# Patient Record
Sex: Female | Born: 1970 | Race: Black or African American | Hispanic: No | Marital: Single | Smoking: Current every day smoker
Health system: Southern US, Community
[De-identification: ages and names within clinical notes are randomized; demographics above are authoritative.]

## PROBLEM LIST (undated history)

## (undated) DIAGNOSIS — K219 Gastro-esophageal reflux disease without esophagitis: Secondary | ICD-10-CM

## (undated) DIAGNOSIS — G8918 Other acute postprocedural pain: Secondary | ICD-10-CM

## (undated) DIAGNOSIS — D219 Benign neoplasm of connective and other soft tissue, unspecified: Secondary | ICD-10-CM

## (undated) HISTORY — PX: TUBAL LIGATION: SHX77

---

## 1999-12-11 ENCOUNTER — Inpatient Hospital Stay (HOSPITAL_COMMUNITY): Admission: AD | Admit: 1999-12-11 | Discharge: 1999-12-11 | Payer: Self-pay | Admitting: Obstetrics

## 2001-07-02 ENCOUNTER — Emergency Department (HOSPITAL_COMMUNITY): Admission: EM | Admit: 2001-07-02 | Discharge: 2001-07-02 | Payer: Self-pay | Admitting: Emergency Medicine

## 2001-07-02 ENCOUNTER — Encounter: Payer: Self-pay | Admitting: Emergency Medicine

## 2002-06-20 ENCOUNTER — Emergency Department (HOSPITAL_COMMUNITY): Admission: EM | Admit: 2002-06-20 | Discharge: 2002-06-20 | Payer: Self-pay | Admitting: Physical Therapy

## 2003-09-30 ENCOUNTER — Emergency Department (HOSPITAL_COMMUNITY): Admission: EM | Admit: 2003-09-30 | Discharge: 2003-09-30 | Payer: Self-pay | Admitting: Family Medicine

## 2007-04-13 ENCOUNTER — Emergency Department (HOSPITAL_COMMUNITY): Admission: EM | Admit: 2007-04-13 | Discharge: 2007-04-13 | Payer: Self-pay | Admitting: Emergency Medicine

## 2007-11-20 ENCOUNTER — Emergency Department (HOSPITAL_COMMUNITY): Admission: EM | Admit: 2007-11-20 | Discharge: 2007-11-20 | Payer: Self-pay | Admitting: Emergency Medicine

## 2007-11-29 ENCOUNTER — Inpatient Hospital Stay (HOSPITAL_COMMUNITY): Admission: AD | Admit: 2007-11-29 | Discharge: 2007-11-29 | Payer: Self-pay | Admitting: Family Medicine

## 2008-04-02 ENCOUNTER — Inpatient Hospital Stay (HOSPITAL_COMMUNITY): Admission: AD | Admit: 2008-04-02 | Discharge: 2008-04-02 | Payer: Self-pay | Admitting: Obstetrics & Gynecology

## 2008-12-10 ENCOUNTER — Inpatient Hospital Stay (HOSPITAL_COMMUNITY): Admission: AD | Admit: 2008-12-10 | Discharge: 2008-12-10 | Payer: Self-pay | Admitting: Obstetrics & Gynecology

## 2010-01-14 ENCOUNTER — Emergency Department (HOSPITAL_COMMUNITY)
Admission: EM | Admit: 2010-01-14 | Discharge: 2010-01-14 | Payer: Self-pay | Source: Home / Self Care | Admitting: Emergency Medicine

## 2010-01-14 LAB — RAPID STREP SCREEN (MED CTR MEBANE ONLY): Streptococcus, Group A Screen (Direct): NEGATIVE

## 2010-01-17 ENCOUNTER — Emergency Department (HOSPITAL_COMMUNITY)
Admission: EM | Admit: 2010-01-17 | Discharge: 2010-01-17 | Payer: Self-pay | Source: Home / Self Care | Admitting: Emergency Medicine

## 2010-04-01 ENCOUNTER — Inpatient Hospital Stay (INDEPENDENT_AMBULATORY_CARE_PROVIDER_SITE_OTHER)
Admission: RE | Admit: 2010-04-01 | Discharge: 2010-04-01 | Disposition: A | Discharge status: Home / Self Care | Payer: Self-pay | Source: Ambulatory Visit | Attending: Family Medicine | Admitting: Family Medicine

## 2010-04-01 DIAGNOSIS — J40 Bronchitis, not specified as acute or chronic: Secondary | ICD-10-CM

## 2010-04-01 DIAGNOSIS — J029 Acute pharyngitis, unspecified: Secondary | ICD-10-CM

## 2010-04-01 LAB — POCT RAPID STREP A (OFFICE): Streptococcus, Group A Screen (Direct): NEGATIVE

## 2010-04-13 LAB — GC/CHLAMYDIA PROBE AMP, GENITAL
Chlamydia, DNA Probe: NEGATIVE
GC Probe Amp, Genital: NEGATIVE

## 2010-04-13 LAB — WET PREP, GENITAL
Trich, Wet Prep: NONE SEEN
Yeast Wet Prep HPF POC: NONE SEEN

## 2010-04-22 LAB — GC/CHLAMYDIA PROBE AMP, GENITAL
Chlamydia, DNA Probe: NEGATIVE
GC Probe Amp, Genital: NEGATIVE

## 2010-04-22 LAB — WET PREP, GENITAL
Trich, Wet Prep: NONE SEEN
Yeast Wet Prep HPF POC: NONE SEEN

## 2010-04-22 LAB — URINALYSIS, ROUTINE W REFLEX MICROSCOPIC
Bilirubin Urine: NEGATIVE
Glucose, UA: NEGATIVE mg/dL
Hgb urine dipstick: NEGATIVE
Ketones, ur: 15 mg/dL — AB
Nitrite: NEGATIVE
Protein, ur: NEGATIVE mg/dL
Specific Gravity, Urine: 1.02 (ref 1.005–1.030)
Urobilinogen, UA: 1 mg/dL (ref 0.0–1.0)
pH: 7.5 (ref 5.0–8.0)

## 2010-04-22 LAB — POCT PREGNANCY, URINE: Preg Test, Ur: NEGATIVE

## 2010-10-12 LAB — BASIC METABOLIC PANEL
BUN: 13
CO2: 28
Calcium: 9.4
Chloride: 100
Creatinine, Ser: 0.73
GFR calc Af Amer: >60
GFR calc non Af Amer: >60
Glucose, Bld: 101 — ABNORMAL HIGH
Potassium: 3.6
Sodium: 139

## 2010-10-12 LAB — GC/CHLAMYDIA PROBE AMP, GENITAL
Chlamydia, DNA Probe: NEGATIVE
GC Probe Amp, Genital: NEGATIVE

## 2010-10-12 LAB — WET PREP, GENITAL
Clue Cells Wet Prep HPF POC: NONE SEEN
Yeast Wet Prep HPF POC: NONE SEEN

## 2010-10-12 LAB — DIFFERENTIAL
Basophils Absolute: 0
Basophils Relative: 0
Eosinophils Absolute: 0
Eosinophils Relative: 1
Lymphocytes Relative: 36
Lymphs Abs: 2.4
Monocytes Absolute: 0.6
Monocytes Relative: 9
Neutro Abs: 3.5
Neutrophils Relative %: 53

## 2010-10-12 LAB — POCT PREGNANCY, URINE
Preg Test, Ur: NEGATIVE
Preg Test, Ur: NEGATIVE

## 2010-10-12 LAB — CBC
HCT: 33.6 — ABNORMAL LOW
Hemoglobin: 11.1 — ABNORMAL LOW
MCHC: 33.1
MCV: 90.9
Platelets: 302
RBC: 3.69 — ABNORMAL LOW
RDW: 15.1
WBC: 6.6

## 2010-10-12 LAB — URINE MICROSCOPIC-ADD ON

## 2010-10-12 LAB — URINALYSIS, ROUTINE W REFLEX MICROSCOPIC
Bilirubin Urine: NEGATIVE
Bilirubin Urine: NEGATIVE
Glucose, UA: NEGATIVE
Glucose, UA: NEGATIVE
Hgb urine dipstick: NEGATIVE
Hgb urine dipstick: NEGATIVE
Ketones, ur: NEGATIVE
Ketones, ur: NEGATIVE
Nitrite: NEGATIVE
Nitrite: NEGATIVE
Protein, ur: NEGATIVE
Protein, ur: NEGATIVE
Specific Gravity, Urine: 1.015
Specific Gravity, Urine: 1.015
Urobilinogen, UA: 0.2
Urobilinogen, UA: 0.2
pH: 6.5
pH: 8.5 — ABNORMAL HIGH

## 2010-10-12 LAB — POCT CARDIAC MARKERS
CKMB, poc: <1 — ABNORMAL LOW
Myoglobin, poc: 37.7
Troponin i, poc: <0.05

## 2011-05-23 ENCOUNTER — Encounter (HOSPITAL_COMMUNITY): Payer: Self-pay | Admitting: Emergency Medicine

## 2011-05-23 ENCOUNTER — Emergency Department (HOSPITAL_COMMUNITY)
Admission: EM | Admit: 2011-05-23 | Discharge: 2011-05-23 | Disposition: A | Discharge status: Home / Self Care | Payer: Self-pay | Attending: Emergency Medicine | Admitting: Emergency Medicine

## 2011-05-23 DIAGNOSIS — R509 Fever, unspecified: Secondary | ICD-10-CM | POA: Insufficient documentation

## 2011-05-23 DIAGNOSIS — K5289 Other specified noninfective gastroenteritis and colitis: Secondary | ICD-10-CM | POA: Insufficient documentation

## 2011-05-23 DIAGNOSIS — K529 Noninfective gastroenteritis and colitis, unspecified: Secondary | ICD-10-CM

## 2011-05-23 HISTORY — DX: Gastro-esophageal reflux disease without esophagitis: K21.9

## 2011-05-23 MED ORDER — ONDANSETRON HCL 8 MG PO TABS: 8.0000 mg | ORAL_TABLET | Freq: Three times a day (TID) | ORAL | Status: AC | PRN

## 2011-05-23 MED ORDER — ONDANSETRON 4 MG PO TBDP
8.0000 mg | ORAL_TABLET | Freq: Once | ORAL | Status: AC
  Administered 2011-05-23: 8 mg via ORAL
  Filled 2011-05-23: qty 2

## 2011-05-23 NOTE — ED Notes (Signed)
N/v/d since last night now her throat hurts

## 2011-05-23 NOTE — ED Notes (Signed)
Gave pt apple juice to drink. Pt tolerated.

## 2011-05-23 NOTE — ED Provider Notes (Signed)
History     CSN: [id number]  Arrival date & time 05/23/11  1343   First MD Initiated Contact with Patient 05/23/11 1614      Chief Complaint  Patient presents with  . Diarrhea    (Consider location/radiation/quality/duration/timing/severity/associated sxs/prior treatment) HPI Comments: Olivia Dunn is a 41 y.o. Female with nausea, vomiting, and diarrhea that started yesterday. No blood in emesis or stool. She is a mild, low-grade fever. She denies weakness, dizziness, or syncope and near-syncope. No cough, shortness of breath, chest pain or back pain. No medications at home. She is unable to eat or drink because it causes vomiting.  Patient is a 41 y.o. female presenting with diarrhea. The history is provided by the patient.  Diarrhea The primary symptoms include diarrhea.    Past Medical History  Diagnosis Date  . Acid reflux     History reviewed. No pertinent past surgical history.  No family history on file.  History  Substance Use Topics  . Smoking status: Current Everyday Smoker  . Smokeless tobacco: Not on file  . Alcohol Use: Yes    OB History    Grav Para Term Preterm Abortions TAB SAB Ect Mult Living                  Review of Systems  Gastrointestinal: Positive for diarrhea.  All other systems reviewed and are negative.    Allergies  Review of patient's allergies indicates no known allergies.  Home Medications   Current Outpatient Rx  Name Route Sig Dispense Refill  . ONDANSETRON HCL 8 MG PO TABS Oral Take 1 tablet (8 mg total) by mouth every 8 (eight) hours as needed for nausea. 40 tablet 0    BP 123/67  Pulse 80  Temp(Src) 98.9 F (37.2 C) (Oral)  Resp 20  SpO2 99%  Physical Exam  Nursing note and vitals reviewed. Constitutional: She is oriented to person, place, and time. She appears well-developed and well-nourished.  HENT:  Head: Normocephalic and atraumatic.  Eyes: Conjunctivae and EOM are normal. Pupils are equal, round,  and reactive to light.  Neck: Normal range of motion and phonation normal. Neck supple.  Cardiovascular: Normal rate, regular rhythm and intact distal pulses.   Pulmonary/Chest: Effort normal and breath sounds normal. She exhibits no tenderness.  Abdominal: Soft. She exhibits no distension. There is no tenderness. There is no guarding.  Musculoskeletal: Normal range of motion.  Neurological: She is alert and oriented to person, place, and time. She has normal strength. She exhibits normal muscle tone.  Skin: Skin is warm and dry.  Psychiatric: She has a normal mood and affect. Her behavior is normal. Judgment and thought content normal.    ED Course  Procedures (including critical care time)  Treated with Zofran; able to tolerate fluids afterwards.   Labs Reviewed - No data to display No results found.   1. Gastroenteritis       MDM  Evaluation Consistent with viral gastroenteritis. Doubt, suspect he'll illness metabolic instability or impending vascular collapse.  Plan: Home Medications- zofran; Home Treatments- gradual diet advance; Recommended follow up- PCP of choice prn        Flint Melter, MD 05/23/11 [id number]

## 2011-05-23 NOTE — Discharge Instructions (Signed)
Use a clear liquid diet for one day then a bland diet, until feeling better.  B.R.A.T. Diet Your doctor has recommended the B.R.A.T. diet for you or your child until the condition improves. This is often used to help control diarrhea and vomiting symptoms. If you or your child can tolerate clear liquids, you may have:  Bananas.   Rice.   Applesauce.   Toast (and other simple starches such as crackers, potatoes, noodles).  Be sure to avoid dairy products, meats, and fatty foods until symptoms are better. Fruit juices such as apple, grape, and prune juice can make diarrhea worse. Avoid these. Continue this diet for 2 days or as instructed by your caregiver. Document Released: 12/27/2004 Document Revised: 12/16/2010 Document Reviewed: 06/15/2006 [facility] Patient Information 2012 [place], [place].Clear Liquid Diet The clear liquid dietconsists of foods that are liquid or will become liquid at room temperature.You should be able to see through the liquid and beverages. Examples of foods allowed on a clear liquid diet include fruit juice, broth or bouillon, gelatin, or frozen ice pops. The purpose of this diet is to provide necessary fluid, electrolytes such as sodium and potassium, and energy to keep the body functioning during times when you are not able to consume a regular diet.A clear liquid diet should not be continued for long periods of time as it is not nutritionally adequate.  REASONS FOR USING A CLEAR LIQUID DIET  In sudden onset (acute) conditions for a patient before or after surgery.   As the first step in oral feeding.   For fluid and electrolyte replacement in diarrheal diseases.   As a diet before certain medical tests are performed.  ADEQUACY The clear liquid diet is adequate only in ascorbic acid, according to the Recommended Dietary Allowances of the [employer]. CHOOSING FOODS Breads and Starches  Allowed:  None are allowed.   Avoid: All are avoided.    Vegetables  Allowed:  Strained tomato or vegetable juice.   Avoid: Any others.  Fruit  Allowed:  Strained fruit juices and fruit drinks. Include 1 serving of citrus or vitamin C-enriched fruit juice daily.   Avoid: Any others.  Meat and Meat Substitutes  Allowed:  None are allowed.   Avoid: All are avoided.  Milk  Allowed:  None are allowed.   Avoid: All are avoided.  Soups and Combination Foods  Allowed:  Clear bouillon, broth, or strained broth-based soups.   Avoid: Any others.  Desserts and Sweets  Allowed:  Sugar, honey. High protein gelatin. Flavored gelatin, ices, or frozen ice pops that do not contain milk.   Avoid: Any others.  Fats and Oils  Allowed:  None are allowed.   Avoid: All are avoided.  Beverages  Allowed: Cereal beverages, coffee (regular or decaffeinated), tea, or soda at the discretion of your caregiver.   Avoid: Any others.  Condiments  Allowed:  Iodized salt.   Avoid: Any others, including pepper.  Supplements  Allowed:  Liquid nutrition beverages.   Avoid: Any others that contain lactose or fiber.  SAMPLE MEAL PLAN Breakfast  4 oz (120 mL) strained orange juice.    to 1 cup (125 to 250 mL) gelatin (plain or fortified).   1 cup (250 mL) beverage (coffee or tea).   Sugar, if desired.  Midmorning Snack   cup (125 mL) gelatin (plain or fortified).  Lunch  1 cup (250 mL) broth or consomm.   4 oz (120 mL) strained grapefruit juice.    cup (125  mL) gelatin (plain or fortified).   1 cup (250 mL) beverage (coffee or tea).   Sugar, if desired.  Midafternoon Snack   cup (125 mL) fruit ice.    cup (125 mL) strained fruit juice.  Dinner  1 cup (250 mL) broth or consomm.    cup (125 mL) cranberry juice.    cup (125 mL) flavored gelatin (plain or fortified).   1 cup (250 mL) beverage (coffee or tea).   Sugar, if desired.  Evening Snack  4 oz (120 mL) strained apple juice (vitamin C-fortified).    cup  (125 mL) flavored gelatin (plain or fortified).  Document Released: 12/27/2004 Document Revised: 12/16/2010 Document Reviewed: 03/26/2010 [facility] Patient Information 2012 [place], [place].

## 2011-05-23 NOTE — ED Notes (Addendum)
Pt reports N/V/D x1 day, pt last experienced N/V/D 0800 this am, pt reports she has been in the bed since 0830 this am, woke up at noon feeling weak and has been here ever since 1300 today. Pt drank a half of bottle of apple juice while in the waiting room and has been able to keep the juice down. Pt is requesting a dr.s note excusing her from work 3-4 days.

## 2012-01-01 ENCOUNTER — Emergency Department (HOSPITAL_COMMUNITY)
Admission: EM | Admit: 2012-01-01 | Discharge: 2012-01-01 | Disposition: A | Discharge status: Home Health | Payer: Self-pay | Attending: Emergency Medicine | Admitting: Emergency Medicine

## 2012-01-01 ENCOUNTER — Encounter (HOSPITAL_COMMUNITY): Payer: Self-pay | Admitting: *Deleted

## 2012-01-01 DIAGNOSIS — W230XXA Caught, crushed, jammed, or pinched between moving objects, initial encounter: Secondary | ICD-10-CM | POA: Insufficient documentation

## 2012-01-01 DIAGNOSIS — S81839A Puncture wound without foreign body, unspecified lower leg, initial encounter: Secondary | ICD-10-CM

## 2012-01-01 DIAGNOSIS — Y929 Unspecified place or not applicable: Secondary | ICD-10-CM | POA: Insufficient documentation

## 2012-01-01 DIAGNOSIS — F17200 Nicotine dependence, unspecified, uncomplicated: Secondary | ICD-10-CM | POA: Insufficient documentation

## 2012-01-01 DIAGNOSIS — Z79899 Other long term (current) drug therapy: Secondary | ICD-10-CM | POA: Insufficient documentation

## 2012-01-01 DIAGNOSIS — S81009A Unspecified open wound, unspecified knee, initial encounter: Secondary | ICD-10-CM | POA: Insufficient documentation

## 2012-01-01 DIAGNOSIS — Y939 Activity, unspecified: Secondary | ICD-10-CM | POA: Insufficient documentation

## 2012-01-01 DIAGNOSIS — S91009A Unspecified open wound, unspecified ankle, initial encounter: Secondary | ICD-10-CM | POA: Insufficient documentation

## 2012-01-01 DIAGNOSIS — Z8719 Personal history of other diseases of the digestive system: Secondary | ICD-10-CM | POA: Insufficient documentation

## 2012-01-01 DIAGNOSIS — L0291 Cutaneous abscess, unspecified: Secondary | ICD-10-CM | POA: Insufficient documentation

## 2012-01-01 DIAGNOSIS — L0390 Cellulitis, unspecified: Secondary | ICD-10-CM

## 2012-01-01 MED ORDER — HYDROCODONE-ACETAMINOPHEN 5-325 MG PO TABS: 1.0000 | ORAL_TABLET | Freq: Once | ORAL | Status: DC

## 2012-01-01 MED ORDER — HYDROCODONE-ACETAMINOPHEN 5-325 MG PO TABS: ORAL_TABLET | ORAL | Status: DC

## 2012-01-01 MED ORDER — CEPHALEXIN 250 MG PO CAPS: 500.0000 mg | ORAL_CAPSULE | Freq: Once | ORAL | Status: DC

## 2012-01-01 MED ORDER — CEPHALEXIN 500 MG PO CAPS: 500.0000 mg | ORAL_CAPSULE | Freq: Two times a day (BID) | ORAL | Status: DC

## 2012-01-01 NOTE — ED Notes (Addendum)
C/o ankle injury Friday night, car door hit R lateral ankle/leg, c/o pain and swelling. Abrasion noted to R lateral ankle. Pinpoints pain to medial and posterior ankle leg/lower calf. "concerned for infection". No known fever. Warm to touch, redness noted.

## 2012-01-01 NOTE — Discharge Instructions (Signed)
Cellulitis Cellulitis is an infection of the skin and the tissue beneath it. The infected area is usually red and tender. Cellulitis occurs most often in the arms and lower legs.   CAUSES   Cellulitis is caused by bacteria that enter the skin through cracks or cuts in the skin. The most common types of bacteria that cause cellulitis are Staphylococcus and Streptococcus. SYMPTOMS    Redness and warmth.   Swelling.   Tenderness or pain.   Fever.  DIAGNOSIS  Your caregiver can usually determine what is wrong based on a physical exam. Blood tests may also be done. TREATMENT   Treatment usually involves taking an antibiotic medicine. HOME CARE INSTRUCTIONS    Take your antibiotics as directed. Finish them even if you start to feel better.   Keep the infected arm or leg elevated to reduce swelling.   Apply a warm cloth to the affected area up to 4 times per day to relieve pain.   Only take over-the-counter or prescription medicines for pain, discomfort, or fever as directed by your caregiver.   Keep all follow-up appointments as directed by your caregiver.  SEEK MEDICAL CARE IF:    You notice red streaks coming from the infected area.   Your red area gets larger or turns dark in color.   Your bone or joint underneath the infected area becomes painful after the skin has healed.   Your infection returns in the same area or another area.   You notice a swollen bump in the infected area.   You develop new symptoms.  SEEK IMMEDIATE MEDICAL CARE IF:    You have a fever.   You feel very sleepy.   You develop vomiting or diarrhea.   You have a general ill feeling (malaise) with muscle aches and pains.  MAKE SURE YOU:    Understand these instructions.   Will watch your condition.   Will get help right away if you are not doing well or get worse.  Document Released: 10/06/2004 Document Revised: 06/28/2011 Document Reviewed: 03/14/2011 [employer] Patient Information 2013  [employer].    

## 2012-01-01 NOTE — ED Provider Notes (Signed)
History     CSN: [id number]  Arrival date & time 01/01/12  2230   First MD Initiated Contact with Patient 01/01/12 2256      Chief Complaint  Patient presents with  . Ankle Pain    (Consider location/radiation/quality/duration/timing/severity/associated sxs/prior treatment) HPI Comments: Pt scratched the outer, lateral portion of right lower leg 2 days ago on car door corner.  Pt was wearing pants.  Pt has no h/o DM, immunocompromise.  She thought she just had a small scratch.  The next day, noticed that she had a slight puncture wound and abrasion there.  No bleeding.  Pain has progressed, she feels lower leg is more swollen and she cannot bear weight that well on it.  No systemic fevers or chills, no numbness or weakness.  She continued shopping on the day she injured it, had no difficulty with walking.  Pain is sharp and throbbing in nature.  No CP, SOB, palpitations, cough, pleuritic pain.  No recent long distance travel otherwise.    Patient is a 41 y.o. female presenting with ankle pain. The history is provided by the patient.  Ankle Pain  Pertinent negatives include no numbness.    Past Medical History  Diagnosis Date  . Acid reflux     Past Surgical History  Procedure Date  . Tubal ligation     History reviewed. No pertinent family history.  History  Substance Use Topics  . Smoking status: Current Every Day Smoker  . Smokeless tobacco: Not on file  . Alcohol Use: Yes    OB History    Grav Para Term Preterm Abortions TAB SAB Ect Mult Living                  Review of Systems  Constitutional: Negative for fever and chills.  Respiratory: Negative for cough and shortness of breath.   Cardiovascular: Positive for leg swelling. Negative for chest pain.  Musculoskeletal: Positive for arthralgias. Negative for joint swelling.  Skin: Positive for wound.  Neurological: Negative for weakness and numbness.  All other systems reviewed and are  negative.    Allergies  Review of patient's allergies indicates no known allergies.  Home Medications   Current Outpatient Rx  Name  Route  Sig  Dispense  Refill  . ADULT MULTIVITAMIN W/MINERALS CH   Oral   Take 1 tablet by mouth daily.         . CEPHALEXIN 500 MG PO CAPS   Oral   Take 1 capsule (500 mg total) by mouth 2 (two) times daily.   14 capsule   0   . HYDROCODONE-ACETAMINOPHEN 5-325 MG PO TABS      1-2 tablets po q 6 hours prn moderate to severe pain   20 tablet   0     BP 132/57  Pulse 82  Temp 98.3 F (36.8 C) (Oral)  Resp 16  SpO2 100%  LMP 12/26/2011  Physical Exam  Nursing note and vitals reviewed. Constitutional: She appears well-developed and well-nourished.  Non-toxic appearance. She does not have a sickly appearance. She does not appear ill. No distress.  HENT:  Head: Normocephalic and atraumatic.  Neck: Normal range of motion.  Cardiovascular: Normal rate, regular rhythm and normal pulses.   Pulmonary/Chest: Effort normal. No respiratory distress.  Abdominal: Soft.  Musculoskeletal:       Right knee: Normal.       Right ankle: She exhibits decreased range of motion and swelling. She exhibits no ecchymosis, no deformity  and normal pulse.       Legs:      Locally tender at area of abrasion.  Wound is clean, no sig surrounding erythema.  No purulent drainage.  Neg Homan's on right lower calf  Neurological: She is alert. She has normal strength. No sensory deficit. She exhibits normal muscle tone. GCS eye subscore is 4. GCS verbal subscore is 5. GCS motor subscore is 6.  Skin: She is not diaphoretic.    ED Course  Procedures (including critical care time)  Labs Reviewed - No data to display No results found.   1. Cellulitis   2. Puncture wound of lower leg     ra sat is 100% and i interpret to be normal  MDM  Wound doesn't require closure, is more rounding.  Puncture is not deep.  No abscess.  No symptoms or signs of DVT or PE.   Will give analgesics and abx.  Recommend close follow up or return if symptoms of infection worsen.  Pt and family agree, no further questions.         Gavin Pound. Oletta Lamas, MD 01/01/12 6213

## 2012-04-30 ENCOUNTER — Emergency Department (HOSPITAL_COMMUNITY): Payer: Self-pay

## 2012-04-30 ENCOUNTER — Emergency Department (HOSPITAL_COMMUNITY)
Admission: EM | Admit: 2012-04-30 | Discharge: 2012-04-30 | Disposition: A | Discharge status: Home / Self Care | Payer: Self-pay | Attending: Emergency Medicine | Admitting: Emergency Medicine

## 2012-04-30 ENCOUNTER — Encounter (HOSPITAL_COMMUNITY): Payer: Self-pay | Admitting: Emergency Medicine

## 2012-04-30 DIAGNOSIS — R059 Cough, unspecified: Secondary | ICD-10-CM | POA: Insufficient documentation

## 2012-04-30 DIAGNOSIS — J329 Chronic sinusitis, unspecified: Secondary | ICD-10-CM | POA: Insufficient documentation

## 2012-04-30 DIAGNOSIS — R05 Cough: Secondary | ICD-10-CM | POA: Insufficient documentation

## 2012-04-30 DIAGNOSIS — F17200 Nicotine dependence, unspecified, uncomplicated: Secondary | ICD-10-CM | POA: Insufficient documentation

## 2012-04-30 DIAGNOSIS — Z8719 Personal history of other diseases of the digestive system: Secondary | ICD-10-CM | POA: Insufficient documentation

## 2012-04-30 MED ORDER — GUAIFENESIN 100 MG/5ML PO SYRP: 100.0000 mg | ORAL_SOLUTION | ORAL | Status: DC | PRN

## 2012-04-30 MED ORDER — DEXTROMETHORPHAN POLISTIREX 30 MG/5ML PO LQCR
10.0000 mL | Freq: Once | ORAL | Status: AC
  Administered 2012-04-30: 60 mg via ORAL
  Filled 2012-04-30: qty 10

## 2012-04-30 MED ORDER — DIPHENHYDRAMINE HCL 25 MG PO CAPS
25.0000 mg | ORAL_CAPSULE | Freq: Once | ORAL | Status: AC
  Administered 2012-04-30: 25 mg via ORAL
  Filled 2012-04-30: qty 1

## 2012-04-30 MED ORDER — SULFAMETHOXAZOLE-TRIMETHOPRIM 800-160 MG PO TABS: 1.0000 | ORAL_TABLET | Freq: Two times a day (BID) | ORAL | Status: DC

## 2012-04-30 NOTE — ED Notes (Signed)
Pt reports fever cough, congestion for 4 weeks with no relief from OTC meds. Today she also reports pain on left side when she coughs. Pt reports some SOB and did have syncopal episode last week while at work. And was seen in the ER at the [facility] she works in.

## 2012-04-30 NOTE — ED Provider Notes (Signed)
Medical screening examination/treatment/procedure(s) were performed by non-physician practitioner and as supervising physician I was immediately available for consultation/collaboration.    Devri Kreher R Krystofer Hevener, MD 04/30/12 2231 

## 2012-04-30 NOTE — ED Notes (Signed)
Pt given and explained discharge instructions; stated understanding; pt stable at discharge

## 2012-04-30 NOTE — ED Provider Notes (Signed)
History     CSN: [id number]  Arrival date & time 04/30/12  1357   First MD Initiated Contact with Patient 04/30/12 1533      No chief complaint on file.   (Consider location/radiation/quality/duration/timing/severity/associated sxs/prior treatment) HPI Comments: Patient is a 42 year old female who presents with sinus congestion for the past 4 weeks. Symptoms started gradually and progressively worsened since the onset. Patient reports associated cough that is sometime productive. Patient has tried OTC medications without relief. Patient reports left sided chest pain only when coughing. No aggravating/alleviating factors. No sick contacts.    Past Medical History  Diagnosis Date  . Acid reflux     Past Surgical History  Procedure Laterality Date  . Tubal ligation      No family history on file.  History  Substance Use Topics  . Smoking status: Current Every Day Smoker  . Smokeless tobacco: Not on file  . Alcohol Use: Yes    OB History   Grav Para Term Preterm Abortions TAB SAB Ect Mult Living                  Review of Systems  HENT: Positive for congestion and sinus pressure.   Respiratory: Positive for cough.   All other systems reviewed and are negative.    Allergies  Review of patient's allergies indicates no known allergies.  Home Medications   Current Outpatient Rx  Name  Route  Sig  Dispense  Refill  . DM-Phenylephrine-Acetaminophen (ALKA-SELTZER PLS SINUS & COUGH) 10-5-325 MG CAPS   Oral   Take 2 capsules by mouth every 4 (four) hours as needed (as needed for cough and congestion).         Marland Kitchen guaiFENesin-dextromethorphan (ROBITUSSIN DM) 100-10 MG/5ML syrup   Oral   Take 30 mLs by mouth every 4 (four) hours as needed for cough.         . Phenyleph-CPM-DM-APAP (TYLENOL COLD MULTI-SYMPTOM PO)   Oral   Take 2 tablets by mouth every 4 (four) hours as needed (as needed for cold symptoms).           BP 122/73  Pulse 79  Temp(Src) 98.7 F  (37.1 C) (Oral)  Resp 18  SpO2 99%  LMP 04/05/2012  Physical Exam  Nursing note and vitals reviewed. Constitutional: She is oriented to person, place, and time. She appears well-developed and well-nourished. No distress.  HENT:  Head: Normocephalic and atraumatic.  Mouth/Throat: Oropharynx is clear and moist. No oropharyngeal exudate.  Maxillary sinus tenderness to palpation.   Eyes: Conjunctivae and EOM are normal. Pupils are equal, round, and reactive to light.  Neck: Normal range of motion. Neck supple.  Cardiovascular: Normal rate and regular rhythm.  Exam reveals no gallop and no friction rub.   No murmur heard. Pulmonary/Chest: Effort normal and breath sounds normal. She has no wheezes. She has no rales. She exhibits no tenderness.  Abdominal: Soft. She exhibits no distension. There is no tenderness. There is no rebound and no guarding.  Musculoskeletal: Normal range of motion.  Neurological: She is alert and oriented to person, place, and time. Coordination normal.  Speech is goal-oriented. Moves limbs without ataxia.   Skin: Skin is warm and dry.  Psychiatric: She has a normal mood and affect. Her behavior is normal.    ED Course  Procedures (including critical care time)  Labs Reviewed - No data to display Dg Chest 2 View  04/30/2012  *RADIOLOGY REPORT*  Clinical Data: Cough.  Back  and left chest pain.  CHEST - 2 VIEW  Comparison: 11/20/2007  Findings: Cardiac and mediastinal contours appear normal.  The lungs appear clear.  No pleural effusion is identified.  IMPRESSION:  No significant abnormality identified.   Original Report Authenticated By: Gaylyn Rong, M.D.      1. Sinusitis       MDM  3:48 PM Chest xray unremarkable for acute changes. Patient likely has sinus infection. I will discharge the patient with bactrim and guaifenesin. Vitals stable and patient is afebrile. Patient is PERC negative. Patient instructed to return with worsening or concerning  symptoms.         Emilia Beck, PA-C 04/30/12 1556

## 2012-09-10 ENCOUNTER — Inpatient Hospital Stay (HOSPITAL_COMMUNITY)
Admission: AD | Admit: 2012-09-10 | Discharge: 2012-09-11 | Disposition: A | Discharge status: Home / Self Care | Payer: Self-pay | Source: Ambulatory Visit | Attending: Obstetrics and Gynecology | Admitting: Obstetrics and Gynecology

## 2012-09-10 ENCOUNTER — Encounter (HOSPITAL_COMMUNITY): Payer: Self-pay

## 2012-09-10 DIAGNOSIS — D251 Intramural leiomyoma of uterus: Secondary | ICD-10-CM | POA: Insufficient documentation

## 2012-09-10 DIAGNOSIS — R102 Pelvic and perineal pain: Secondary | ICD-10-CM

## 2012-09-10 DIAGNOSIS — D259 Leiomyoma of uterus, unspecified: Secondary | ICD-10-CM | POA: Insufficient documentation

## 2012-09-10 DIAGNOSIS — N946 Dysmenorrhea, unspecified: Secondary | ICD-10-CM | POA: Insufficient documentation

## 2012-09-10 DIAGNOSIS — R109 Unspecified abdominal pain: Secondary | ICD-10-CM | POA: Insufficient documentation

## 2012-09-10 DIAGNOSIS — D219 Benign neoplasm of connective and other soft tissue, unspecified: Secondary | ICD-10-CM

## 2012-09-10 DIAGNOSIS — N83209 Unspecified ovarian cyst, unspecified side: Secondary | ICD-10-CM | POA: Insufficient documentation

## 2012-09-10 DIAGNOSIS — N949 Unspecified condition associated with female genital organs and menstrual cycle: Secondary | ICD-10-CM | POA: Insufficient documentation

## 2012-09-10 LAB — POCT PREGNANCY, URINE: Preg Test, Ur: NEGATIVE

## 2012-09-10 LAB — URINALYSIS, ROUTINE W REFLEX MICROSCOPIC
Bilirubin Urine: NEGATIVE
Hgb urine dipstick: NEGATIVE
Nitrite: NEGATIVE
Protein, ur: NEGATIVE mg/dL
Specific Gravity, Urine: >1.03 — ABNORMAL HIGH (ref 1.005–1.030)
Urobilinogen, UA: 0.2 mg/dL (ref 0.0–1.0)

## 2012-09-10 NOTE — MAU Provider Note (Signed)
History     CSN: [id number]  Arrival date and time: 09/10/12 2312   None     Chief Complaint  Patient presents with  . Dysmenorrhea  . Possible Pregnancy   HPI  Olivia Dunn is a 42 y.o. [phone] who presents today with 10/10 abdominal pain x 2 weeks. She states that she has tried tylenol, ibuprofen and aleve and none have helped. She denies any nausea/vomiting/fever/diarrhea. She has had some constipation. She states that she has not had a period since July.   She has not had a pap smear in >5 years and reports a history of abnormal paps. Past Medical History  Diagnosis Date  . Acid reflux     Past Surgical History  Procedure Laterality Date  . Tubal ligation      History reviewed. No pertinent family history.  History  Substance Use Topics  . Smoking status: Current Every Day Smoker  . Smokeless tobacco: Not on file  . Alcohol Use: Yes    Allergies: No Known Allergies  Prescriptions prior to admission  Medication Sig Dispense Refill  . acetaminophen (TYLENOL) 500 MG tablet Take 500 mg by mouth every 6 (six) hours as needed for pain.      Marland Kitchen ibuprofen (ADVIL,MOTRIN) 200 MG tablet Take 200 mg by mouth every 6 (six) hours as needed for pain.      . Multiple Vitamin (MULTIVITAMIN) tablet Take 1 tablet by mouth daily.      . naproxen sodium (ANAPROX) 220 MG tablet Take 220 mg by mouth 2 (two) times daily with a meal.      . DM-Phenylephrine-Acetaminophen (ALKA-SELTZER PLS SINUS & COUGH) 10-5-325 MG CAPS Take 2 capsules by mouth every 4 (four) hours as needed (as needed for cough and congestion).      Marland Kitchen guaifenesin (ROBITUSSIN) 100 MG/5ML syrup Take 5-10 mLs (100-200 mg total) by mouth every 4 (four) hours as needed for cough.  60 mL  0  . guaiFENesin-dextromethorphan (ROBITUSSIN DM) 100-10 MG/5ML syrup Take 30 mLs by mouth every 4 (four) hours as needed for cough.      . Phenyleph-CPM-DM-APAP (TYLENOL COLD MULTI-SYMPTOM PO) Take 2 tablets by mouth every 4 (four)  hours as needed (as needed for cold symptoms).      Marland Kitchen sulfamethoxazole-trimethoprim (SEPTRA DS) 800-160 MG per tablet Take 1 tablet by mouth every 12 (twelve) hours.  20 tablet  0    ROS Physical Exam   Blood pressure 137/109, pulse 76, temperature 98 F (36.7 C), temperature source Oral, resp. rate 18, height 5\' 7"  (1.702 m), weight 72.031 kg (158 lb 12.8 oz), last menstrual period 08/02/2012.  Physical Exam  Nursing note and vitals reviewed. Constitutional: She is oriented to person, place, and time. She appears well-developed and well-nourished. No distress.  Cardiovascular: Normal rate.   Respiratory: Effort normal.  GI: Soft. There is no tenderness.  Genitourinary:   External: no lesion Vagina: small amount of white discharge Cervix: pink, small nabothian cyst as well as some apparent scar tissue from a past cryo.  Uterus: NSSC Adnexa: NT   Neurological: She is alert and oriented to person, place, and time.  Skin: Skin is warm and dry.  Psychiatric: She has a normal mood and affect.    MAU Course  Procedures  Results for orders placed during the hospital encounter of 09/10/12 (from the past 24 hour(s))  URINALYSIS, ROUTINE W REFLEX MICROSCOPIC     Status: Abnormal   Collection Time    09/10/12 11:25  PM      Result Value Range   Color, Urine YELLOW  YELLOW   APPearance HAZY (*) CLEAR   Specific Gravity, Urine >1.030 (*) 1.005 - 1.030   pH 6.0  5.0 - 8.0   Glucose, UA NEGATIVE  NEGATIVE mg/dL   Hgb urine dipstick NEGATIVE  NEGATIVE   Bilirubin Urine NEGATIVE  NEGATIVE   Ketones, ur NEGATIVE  NEGATIVE mg/dL   Protein, ur NEGATIVE  NEGATIVE mg/dL   Urobilinogen, UA 0.2  0.0 - 1.0 mg/dL   Nitrite NEGATIVE  NEGATIVE   Leukocytes, UA NEGATIVE  NEGATIVE  POCT PREGNANCY, URINE     Status: None   Collection Time    09/10/12 11:35 PM      Result Value Range   Preg Test, Ur NEGATIVE  NEGATIVE  WET PREP, GENITAL     Status: Abnormal   Collection Time    09/11/12 12:00  AM      Result Value Range   Yeast Wet Prep HPF POC NONE SEEN  NONE SEEN   Trich, Wet Prep NONE SEEN  NONE SEEN   Clue Cells Wet Prep HPF POC FEW (*) NONE SEEN   WBC, Wet Prep HPF POC FEW (*) NONE SEEN  CBC WITH DIFFERENTIAL     Status: Abnormal   Collection Time    09/11/12 12:10 AM      Result Value Range   WBC 6.2  4.0 - 10.5 K/uL   RBC 3.99  3.87 - 5.11 MIL/uL   Hemoglobin 11.5 (*) 12.0 - 15.0 g/dL   HCT 40.9 (*) 81.1 - 91.4 %   MCV 86.0  78.0 - 100.0 fL   MCH 28.8  26.0 - 34.0 pg   MCHC 33.5  30.0 - 36.0 g/dL   RDW 78.2  95.6 - 21.3 %   Platelets 279  150 - 400 K/uL   Neutrophils Relative % 56  43 - 77 %   Neutro Abs 3.5  1.7 - 7.7 K/uL   Lymphocytes Relative 36  12 - 46 %   Lymphs Abs 2.3  0.7 - 4.0 K/uL   Monocytes Relative 7  3 - 12 %   Monocytes Absolute 0.4  0.1 - 1.0 K/uL   Eosinophils Relative 1  0 - 5 %   Eosinophils Absolute 0.0  0.0 - 0.7 K/uL   Basophils Relative 0  0 - 1 %   Basophils Absolute 0.0  0.0 - 0.1 K/uL    US Transvaginal Non-ob  09/11/2012   *RADIOLOGY REPORT*  Clinical Data: Pelvic pain.  LMP 08/02/2012  TRANSABDOMINAL AND TRANSVAGINAL ULTRASOUND OF PELVIS Technique:  Both transabdominal and transvaginal ultrasound examinations of the pelvis were performed. Transabdominal technique was performed for global imaging of the pelvis including uterus, ovaries, adnexal regions, and pelvic cul-de-sac.  It was necessary to proceed with endovaginal exam following the transabdominal exam to visualize the uterus and ovaries.  Comparison:  None  Findings:  Uterus: The uterus measures 8.2 x 6.0 x 6.7 cm and contains at least five focal rounded heterogeneous masses consistent with leiomyomas.  The largest is intramural in the uterine fundus measuring 2.1 x 2.0 x 1.9 cm. The remainder of the fibroids are also intramural, with the second largest measuring  1.6 x 1.2 x 1.7 cm.  Endometrium: The endometrium measures 10 mm in thickness.  Right ovary:  Normal appearance/no  adnexal mass.  Left ovary:There is a 1.2 x 0.8 cm paraovarian cyst on the left. The left ovary measures  2.4 x 2.2 x 2.0 cm and has a normal appearance.  Other findings: Small amount of free pelvic fluid.  IMPRESSION:  2.  Multiple uterine fibroids as described above. 2.  Ovaries within normal limits. 3.  Small left paraovarian cyst.   Original Report Authenticated By: Britta Mccreedy, M.D.   US Pelvis Complete  09/11/2012   *RADIOLOGY REPORT*  Clinical Data: Pelvic pain.  LMP 08/02/2012  TRANSABDOMINAL AND TRANSVAGINAL ULTRASOUND OF PELVIS Technique:  Both transabdominal and transvaginal ultrasound examinations of the pelvis were performed. Transabdominal technique was performed for global imaging of the pelvis including uterus, ovaries, adnexal regions, and pelvic cul-de-sac.  It was necessary to proceed with endovaginal exam following the transabdominal exam to visualize the uterus and ovaries.  Comparison:  None  Findings:  Uterus: The uterus measures 8.2 x 6.0 x 6.7 cm and contains at least five focal rounded heterogeneous masses consistent with leiomyomas.  The largest is intramural in the uterine fundus measuring 2.1 x 2.0 x 1.9 cm. The remainder of the fibroids are also intramural, with the second largest measuring  1.6 x 1.2 x 1.7 cm.  Endometrium: The endometrium measures 10 mm in thickness.  Right ovary:  Normal appearance/no adnexal mass.  Left ovary:There is a 1.2 x 0.8 cm paraovarian cyst on the left. The left ovary measures 2.4 x 2.2 x 2.0 cm and has a normal appearance.  Other findings: Small amount of free pelvic fluid.  IMPRESSION:  2.  Multiple uterine fibroids as described above. 2.  Ovaries within normal limits. 3.  Small left paraovarian cyst.   Original Report Authenticated By: Britta Mccreedy, M.D.     Assessment and Plan   1. Pelvic pain   2. Fibroids    FU with GYN clinic  Needs pap   Tawnya Crook 09/10/2012, 11:59 PM

## 2012-09-10 NOTE — MAU Note (Signed)
Pt LMP 08/02/2012, having lower abd cramping, no bleeding.  Cloudy, white vaginal discharge, no itching or odor.

## 2012-09-11 ENCOUNTER — Inpatient Hospital Stay (HOSPITAL_COMMUNITY): Payer: Self-pay

## 2012-09-11 DIAGNOSIS — D259 Leiomyoma of uterus, unspecified: Secondary | ICD-10-CM

## 2012-09-11 DIAGNOSIS — N946 Dysmenorrhea, unspecified: Secondary | ICD-10-CM

## 2012-09-11 DIAGNOSIS — N949 Unspecified condition associated with female genital organs and menstrual cycle: Secondary | ICD-10-CM

## 2012-09-11 DIAGNOSIS — R109 Unspecified abdominal pain: Secondary | ICD-10-CM

## 2012-09-11 LAB — WET PREP, GENITAL: Trich, Wet Prep: NONE SEEN

## 2012-09-11 LAB — CBC WITH DIFFERENTIAL/PLATELET
Hemoglobin: 11.5 g/dL — ABNORMAL LOW (ref 12.0–15.0)
Lymphocytes Relative: 36 % (ref 12–46)
Lymphs Abs: 2.3 10*3/uL (ref 0.7–4.0)
Monocytes Relative: 7 % (ref 3–12)
Neutrophils Relative %: 56 % (ref 43–77)
Platelets: 279 10*3/uL (ref 150–400)
RBC: 3.99 MIL/uL (ref 3.87–5.11)
WBC: 6.2 10*3/uL (ref 4.0–10.5)

## 2012-09-11 LAB — GC/CHLAMYDIA PROBE AMP: CT Probe RNA: NEGATIVE

## 2012-09-11 MED ORDER — KETOROLAC TROMETHAMINE 60 MG/2ML IM SOLN
60.0000 mg | Freq: Once | INTRAMUSCULAR | Status: AC
  Administered 2012-09-11: 60 mg via INTRAMUSCULAR
  Filled 2012-09-11: qty 2

## 2012-09-11 NOTE — MAU Provider Note (Signed)
Attestation of Attending Supervision of Advanced Practitioner (CNM/NP): Evaluation and management procedures were performed by the Advanced Practitioner under my supervision and collaboration.  I have reviewed the Advanced Practitioner's note and chart, and I agree with the management and plan.  Kaelyn Innocent 09/11/2012 8:05 AM

## 2012-09-18 ENCOUNTER — Encounter: Payer: Self-pay | Admitting: Advanced Practice Midwife

## 2012-10-08 ENCOUNTER — Encounter: Payer: Self-pay | Admitting: Advanced Practice Midwife

## 2012-11-12 ENCOUNTER — Encounter: Payer: Self-pay | Admitting: Obstetrics & Gynecology

## 2013-01-31 ENCOUNTER — Encounter (HOSPITAL_COMMUNITY): Payer: Self-pay | Admitting: Emergency Medicine

## 2013-01-31 ENCOUNTER — Emergency Department (HOSPITAL_COMMUNITY)
Admission: EM | Admit: 2013-01-31 | Discharge: 2013-01-31 | Disposition: A | Discharge status: Home / Self Care | Payer: Self-pay | Attending: Emergency Medicine | Admitting: Emergency Medicine

## 2013-01-31 DIAGNOSIS — R630 Anorexia: Secondary | ICD-10-CM | POA: Insufficient documentation

## 2013-01-31 DIAGNOSIS — Z79899 Other long term (current) drug therapy: Secondary | ICD-10-CM | POA: Insufficient documentation

## 2013-01-31 DIAGNOSIS — Z8719 Personal history of other diseases of the digestive system: Secondary | ICD-10-CM | POA: Insufficient documentation

## 2013-01-31 DIAGNOSIS — J329 Chronic sinusitis, unspecified: Secondary | ICD-10-CM | POA: Insufficient documentation

## 2013-01-31 DIAGNOSIS — F17200 Nicotine dependence, unspecified, uncomplicated: Secondary | ICD-10-CM | POA: Insufficient documentation

## 2013-01-31 MED ORDER — AMOXICILLIN 500 MG PO CAPS: 500.0000 mg | ORAL_CAPSULE | Freq: Three times a day (TID) | ORAL | Status: DC

## 2013-01-31 MED ORDER — FLUTICASONE PROPIONATE 50 MCG/ACT NA SUSP: 2.0000 | Freq: Every day | NASAL | Status: DC

## 2013-01-31 NOTE — ED Notes (Addendum)
Pt. Reports feeling sick for x3 weeks with cough and low-grade fevers. States today it got worse. Reports decreased appetite and fatigue today. Lung sounds clear. Pt. Alert and oriented x4.

## 2013-01-31 NOTE — Discharge Instructions (Signed)
Take antibiotic to completion. Use nasal spray as directed. Stay well-hydrated.  Sinusitis Sinusitis is redness, soreness, and swelling (inflammation) of the paranasal sinuses. Paranasal sinuses are air pockets within the bones of your face (beneath the eyes, the middle of the forehead, or above the eyes). In healthy paranasal sinuses, mucus is able to drain out, and air is able to circulate through them by way of your nose. However, when your paranasal sinuses are inflamed, mucus and air can become trapped. This can allow bacteria and other germs to grow and cause infection. Sinusitis can develop quickly and last only a short time (acute) or continue over a long period (chronic). Sinusitis that lasts for more than 12 weeks is considered chronic.  CAUSES  Causes of sinusitis include:  Allergies.  Structural abnormalities, such as displacement of the cartilage that separates your nostrils (deviated septum), which can decrease the air flow through your nose and sinuses and affect sinus drainage.  Functional abnormalities, such as when the small hairs (cilia) that line your sinuses and help remove mucus do not work properly or are not present. SYMPTOMS  Symptoms of acute and chronic sinusitis are the same. The primary symptoms are pain and pressure around the affected sinuses. Other symptoms include:  Upper toothache.  Earache.  Headache.  Bad breath.  Decreased sense of smell and taste.  A cough, which worsens when you are lying flat.  Fatigue.  Fever.  Thick drainage from your nose, which often is green and may contain pus (purulent).  Swelling and warmth over the affected sinuses. DIAGNOSIS  Your caregiver will perform a physical exam. During the exam, your caregiver may:  Look in your nose for signs of abnormal growths in your nostrils (nasal polyps).  Tap over the affected sinus to check for signs of infection.  View the inside of your sinuses (endoscopy) with a special  imaging device with a light attached (endoscope), which is inserted into your sinuses. If your caregiver suspects that you have chronic sinusitis, one or more of the following tests may be recommended:  Allergy tests.  Nasal culture A sample of mucus is taken from your nose and sent to a lab and screened for bacteria.  Nasal cytology A sample of mucus is taken from your nose and examined by your caregiver to determine if your sinusitis is related to an allergy. TREATMENT  Most cases of acute sinusitis are related to a viral infection and will resolve on their own within 10 days. Sometimes medicines are prescribed to help relieve symptoms (pain medicine, decongestants, nasal steroid sprays, or saline sprays).  However, for sinusitis related to a bacterial infection, your caregiver will prescribe antibiotic medicines. These are medicines that will help kill the bacteria causing the infection.  Rarely, sinusitis is caused by a fungal infection. In theses cases, your caregiver will prescribe antifungal medicine. For some cases of chronic sinusitis, surgery is needed. Generally, these are cases in which sinusitis recurs more than 3 times per year, despite other treatments. HOME CARE INSTRUCTIONS   Drink plenty of water. Water helps thin the mucus so your sinuses can drain more easily.  Use a humidifier.  Inhale steam 3 to 4 times a day (for example, sit in the bathroom with the shower running).  Apply a warm, moist washcloth to your face 3 to 4 times a day, or as directed by your caregiver.  Use saline nasal sprays to help moisten and clean your sinuses.  Take over-the-counter or prescription medicines for pain,  discomfort, or fever only as directed by your caregiver. SEEK IMMEDIATE MEDICAL CARE IF:  You have increasing pain or severe headaches.  You have nausea, vomiting, or drowsiness.  You have swelling around your face.  You have vision problems.  You have a stiff neck.  You have  difficulty breathing. MAKE SURE YOU:   Understand these instructions.  Will watch your condition.  Will get help right away if you are not doing well or get worse. Document Released: 12/27/2004 Document Revised: 03/21/2011 Document Reviewed: 01/11/2011 [facility] Patient Information 2014 [place], [place].

## 2013-01-31 NOTE — ED Provider Notes (Signed)
CSN: [id number]     Arrival date & time 01/31/13  2128 History  This chart was scribed for non-physician practitioner Michele Mcalpine, PA-C, working with Sharyon Cable, MD by Eugenia Mcalpine, ED scribe. This patient was seen in room TR08C/TR08C and the patient's care was started at 10:30 PM.    Chief Complaint  Patient presents with  . Headache  . Sore Throat   (Consider location/radiation/quality/duration/timing/severity/associated sxs/prior Treatment) Patient is a 43 y.o. female presenting with headaches and pharyngitis. The history is provided by the patient. No language interpreter was used.  Headache Associated symptoms: congestion, cough, drainage, sinus pressure and sore throat   Sore Throat Associated symptoms include headaches. Pertinent negatives include no shortness of breath.  HPI Comments: Olivia Dunn is a 43 y.o. female who presents to the Emergency Department complaining of a constant sore throat for about 4 weeks.  She has taken multiple OTC medications with no relief. She lost her appetite 1x day ago. She also complains of head congestion, productive cough with clear phlegm, and head ache.  She works at the [employer] in [place] and is around many sick contacts.    Past Medical History  Diagnosis Date  . Acid reflux    Past Surgical History  Procedure Laterality Date  . Tubal ligation     No family history on file. History  Substance Use Topics  . Smoking status: Current Every Day Smoker -- 0.25 packs/day    Types: Cigarettes  . Smokeless tobacco: Not on file  . Alcohol Use: Yes   OB History   Grav Para Term Preterm Abortions TAB SAB Ect Mult Living   2 2 2       2      Review of Systems  HENT: Positive for congestion, postnasal drip, sinus pressure and sore throat.   Respiratory: Positive for cough. Negative for shortness of breath.   Neurological: Positive for headaches.  All other systems reviewed and are negative.    Allergies   Oxycodone  Home Medications   Current Outpatient Rx  Name  Route  Sig  Dispense  Refill  . BIOTIN PO   Oral   Take 1 tablet by mouth daily.         Marland Kitchen ibuprofen (ADVIL,MOTRIN) 800 MG tablet   Oral   Take 800 mg by mouth every 8 (eight) hours as needed for mild pain.         . Multiple Vitamin (MULTIVITAMIN) tablet   Oral   Take 1 tablet by mouth daily.         Marland Kitchen amoxicillin (AMOXIL) 500 MG capsule   Oral   Take 1 capsule (500 mg total) by mouth 3 (three) times daily.   21 capsule   0   . fluticasone (FLONASE) 50 MCG/ACT nasal spray   Each Nare   Place 2 sprays into both nostrils daily.   16 g   0    BP 124/75  Pulse 75  Temp(Src) 97.8 F (36.6 C) (Oral)  Resp 18  SpO2 100%  LMP 01/21/2013 Physical Exam  Nursing note and vitals reviewed. Constitutional: She is oriented to person, place, and time. She appears well-developed and well-nourished. No distress.  HENT:  Head: Normocephalic and atraumatic.  Mouth/Throat: Oropharynx is clear and moist.  Mucosal edema; frontal and maxillary sinus tenderness; post nasal drip  Eyes: Conjunctivae and EOM are normal.  Neck: Normal range of motion. Neck supple.  Cardiovascular: Normal rate, regular rhythm and normal heart sounds.  Pulmonary/Chest: Effort normal and breath sounds normal. No respiratory distress.  Musculoskeletal: Normal range of motion. She exhibits no edema.  Neurological: She is alert and oriented to person, place, and time. No sensory deficit.  Skin: Skin is warm and dry.  Psychiatric: She has a normal mood and affect. Her behavior is normal.    ED Course  Procedures (including critical care time)  DIAGNOSTIC STUDIES: Oxygen Saturation is 100% on RA, normal by my interpretation.    COORDINATION OF CARE: 10:32 PM- Pt advised of plan for treatment including antibiotic and nasal spray and pt agrees.    Labs Review Labs Reviewed - No data to display Imaging Review No results found.  EKG  Interpretation   None       MDM   1. Sinusitis    Symptoms present x4 weeks, failed symptomatic treatment. Will treat with amoxicillin, Flonase.  Stable for discharge. Return precautions given. Patient states understanding of treatment care plan and is agreeable.   I personally performed the services described in this documentation, which was scribed in my presence. The recorded information has been reviewed and is accurate.    Illene Labrador, PA-C 01/31/13 2239

## 2013-01-31 NOTE — ED Notes (Signed)
Pt states that she is having a headache and sore throat since christmas and has not gotten any better.  Pt states loss of appetite over the past two weeks.

## 2013-02-01 NOTE — ED Provider Notes (Signed)
Medical screening examination/treatment/procedure(s) were performed by non-physician practitioner and as supervising physician I was immediately available for consultation/collaboration.  EKG Interpretation   None         Sharyon Cable, MD 02/01/13 1141

## 2013-03-21 ENCOUNTER — Encounter (HOSPITAL_COMMUNITY): Payer: Self-pay | Admitting: Emergency Medicine

## 2013-03-21 ENCOUNTER — Inpatient Hospital Stay (HOSPITAL_COMMUNITY)
Admission: AD | Admit: 2013-03-21 | Discharge: 2013-03-21 | Disposition: A | Discharge status: Home / Self Care | Payer: Self-pay | Source: Ambulatory Visit | Attending: Obstetrics & Gynecology | Admitting: Obstetrics & Gynecology

## 2013-03-21 DIAGNOSIS — F17200 Nicotine dependence, unspecified, uncomplicated: Secondary | ICD-10-CM | POA: Insufficient documentation

## 2013-03-21 DIAGNOSIS — Z8719 Personal history of other diseases of the digestive system: Secondary | ICD-10-CM | POA: Insufficient documentation

## 2013-03-21 DIAGNOSIS — Z79899 Other long term (current) drug therapy: Secondary | ICD-10-CM | POA: Insufficient documentation

## 2013-03-21 DIAGNOSIS — N946 Dysmenorrhea, unspecified: Secondary | ICD-10-CM | POA: Insufficient documentation

## 2013-03-21 LAB — URINALYSIS, ROUTINE W REFLEX MICROSCOPIC
Bilirubin Urine: NEGATIVE
GLUCOSE, UA: NEGATIVE mg/dL
KETONES UR: 15 mg/dL — AB
LEUKOCYTES UA: NEGATIVE
Nitrite: NEGATIVE
PH: 5.5 (ref 5.0–8.0)
Protein, ur: NEGATIVE mg/dL
Specific Gravity, Urine: 1.028 (ref 1.005–1.030)
Urobilinogen, UA: 0.2 mg/dL (ref 0.0–1.0)

## 2013-03-21 LAB — CBC WITH DIFFERENTIAL/PLATELET
BASOS ABS: 0 10*3/uL (ref 0.0–0.1)
BASOS PCT: 0 % (ref 0–1)
EOS ABS: 0 10*3/uL (ref 0.0–0.7)
Eosinophils Relative: 0 % (ref 0–5)
HCT: 34.9 % — ABNORMAL LOW (ref 36.0–46.0)
Hemoglobin: 11.9 g/dL — ABNORMAL LOW (ref 12.0–15.0)
Lymphocytes Relative: 18 % (ref 12–46)
Lymphs Abs: 1.5 10*3/uL (ref 0.7–4.0)
MCH: 29.4 pg (ref 26.0–34.0)
MCHC: 34.1 g/dL (ref 30.0–36.0)
MCV: 86.2 fL (ref 78.0–100.0)
Monocytes Absolute: 0.6 10*3/uL (ref 0.1–1.0)
Monocytes Relative: 7 % (ref 3–12)
NEUTROS ABS: 5.9 10*3/uL (ref 1.7–7.7)
NEUTROS PCT: 74 % (ref 43–77)
PLATELETS: 334 10*3/uL (ref 150–400)
RBC: 4.05 MIL/uL (ref 3.87–5.11)
RDW: 15 % (ref 11.5–15.5)
WBC: 8 10*3/uL (ref 4.0–10.5)

## 2013-03-21 LAB — COMPREHENSIVE METABOLIC PANEL
ALBUMIN: 4 g/dL (ref 3.5–5.2)
ALK PHOS: 50 U/L (ref 39–117)
ALT: 11 U/L (ref 0–35)
AST: 14 U/L (ref 0–37)
BUN: 9 mg/dL (ref 6–23)
CHLORIDE: 101 meq/L (ref 96–112)
CO2: 25 mEq/L (ref 19–32)
Calcium: 9.2 mg/dL (ref 8.4–10.5)
Creatinine, Ser: 0.9 mg/dL (ref 0.50–1.10)
GFR calc Af Amer: 89 mL/min — ABNORMAL LOW (ref >90)
GFR calc non Af Amer: 77 mL/min — ABNORMAL LOW (ref >90)
Glucose, Bld: 111 mg/dL — ABNORMAL HIGH (ref 70–99)
POTASSIUM: 3.9 meq/L (ref 3.7–5.3)
Sodium: 140 mEq/L (ref 137–147)
TOTAL PROTEIN: 7.2 g/dL (ref 6.0–8.3)

## 2013-03-21 LAB — URINE MICROSCOPIC-ADD ON

## 2013-03-21 NOTE — ED Notes (Signed)
Pt. reports heavy menstrual bleeding onset this morning with mid/lower abdominal cramping , pt. stated history of fibroid tumors.

## 2013-03-21 NOTE — MAU Note (Signed)
Patient is not in the lobby when called to triage. A patient in the lobby states she has left.

## 2013-03-21 NOTE — MAU Note (Signed)
PERSON IN LOBBY  SAID  SHE WAS HER AUNT - AND  THE PT LEFT.

## 2013-03-22 ENCOUNTER — Emergency Department (HOSPITAL_COMMUNITY)
Admission: EM | Admit: 2013-03-22 | Discharge: 2013-03-22 | Disposition: A | Discharge status: Home / Self Care | Payer: Self-pay | Attending: Emergency Medicine | Admitting: Emergency Medicine

## 2013-03-22 DIAGNOSIS — N946 Dysmenorrhea, unspecified: Secondary | ICD-10-CM

## 2013-03-22 HISTORY — DX: Benign neoplasm of connective and other soft tissue, unspecified: D21.9

## 2013-03-22 MED ORDER — HYDROCODONE-ACETAMINOPHEN 5-325 MG PO TABS: 1.0000 | ORAL_TABLET | ORAL | Status: DC | PRN

## 2013-03-22 MED ORDER — KETOROLAC TROMETHAMINE 30 MG/ML IJ SOLN
60.0000 mg | Freq: Once | INTRAMUSCULAR | Status: DC
  Filled 2013-03-22: qty 2

## 2013-03-22 NOTE — Discharge Instructions (Signed)

## 2013-03-23 NOTE — ED Provider Notes (Signed)
CSN: [id number]     Arrival date & time 03/21/13  2204 History   First MD Initiated Contact with Patient 03/22/13 0253     Chief Complaint  Patient presents with  . Menstrual Problem     (Consider location/radiation/quality/duration/timing/severity/associated sxs/prior Treatment) HPI 43 yo female presents to the ER from home with complaint of lower pelvic pain and heavy menstrual bleeding starting today.  Pt has h/o fibroids, reports sxs are c/w with her normal periods.  Pt was scheduled to have procedure with gyn, but had to cancel due to menses, and has not yet rescheduled.  No lightheadness, sob, n/v/d, fever.  No new sexual partners.  No vaginal d/c.   Past Medical History  Diagnosis Date  . Acid reflux   . Fibroids    Past Surgical History  Procedure Laterality Date  . Tubal ligation     No family history on file. History  Substance Use Topics  . Smoking status: Current Every Day Smoker -- 0.25 packs/day    Types: Cigarettes  . Smokeless tobacco: Not on file  . Alcohol Use: Yes   OB History   Grav Para Term Preterm Abortions TAB SAB Ect Mult Living   2 2 2       2      Review of Systems  All other systems reviewed and are negative.      Allergies  Oxycodone  Home Medications   Current Outpatient Rx  Name  Route  Sig  Dispense  Refill  . BIOTIN PO   Oral   Take 1 tablet by mouth daily.         Marland Kitchen ibuprofen (ADVIL,MOTRIN) 800 MG tablet   Oral   Take 800 mg by mouth every 8 (eight) hours as needed for mild pain.         . Multiple Vitamin (MULTIVITAMIN) tablet   Oral   Take 1 tablet by mouth daily.         Marland Kitchen HYDROcodone-acetaminophen (NORCO/VICODIN) 5-325 MG per tablet   Oral   Take 1-2 tablets by mouth every 4 (four) hours as needed for moderate pain.   30 tablet   0    BP 136/72  Pulse 78  Temp(Src) 98.5 F (36.9 C) (Oral)  Resp 18  Ht 5\' 7"  (1.702 m)  Wt 162 lb (73.483 kg)  BMI 25.37 kg/m2  SpO2 100%  LMP 03/21/2013 Physical Exam   Nursing note and vitals reviewed. Constitutional: She is oriented to person, place, and time. She appears well-developed and well-nourished.  HENT:  Head: Normocephalic and atraumatic.  Nose: Nose normal.  Mouth/Throat: Oropharynx is clear and moist.  Eyes: Conjunctivae and EOM are normal. Pupils are equal, round, and reactive to light.  Neck: Normal range of motion. Neck supple. No JVD present. No tracheal deviation present. No thyromegaly present.  Cardiovascular: Normal rate, regular rhythm, normal heart sounds and intact distal pulses.  Exam reveals no gallop and no friction rub.   No murmur heard. Pulmonary/Chest: Effort normal and breath sounds normal. No stridor. No respiratory distress. She has no wheezes. She has no rales. She exhibits no tenderness.  Abdominal: Soft. Bowel sounds are normal. She exhibits no distension and no mass. There is tenderness (moderate suprapubic tenderness). There is no rebound and no guarding.  Musculoskeletal: Normal range of motion. She exhibits no edema and no tenderness.  Lymphadenopathy:    She has no cervical adenopathy.  Neurological: She is alert and oriented to person, place, and time. She exhibits  normal muscle tone. Coordination normal.  Skin: Skin is warm and dry. No rash noted. No erythema. No pallor.  Psychiatric: She has a normal mood and affect. Her behavior is normal. Judgment and thought content normal.    ED Course  Procedures (including critical care time) Labs Review Labs Reviewed  CBC WITH DIFFERENTIAL - Abnormal; Notable for the following:    Hemoglobin 11.9 (*)    HCT 34.9 (*)    All other components within normal limits  COMPREHENSIVE METABOLIC PANEL - Abnormal; Notable for the following:    Glucose, Bld 111 (*)    Total Bilirubin <0.2 (*)    GFR calc non Af Amer 77 (*)    GFR calc Af Amer 89 (*)    All other components within normal limits  URINALYSIS, ROUTINE W REFLEX MICROSCOPIC - Abnormal; Notable for the following:     APPearance CLOUDY (*)    Hgb urine dipstick LARGE (*)    Ketones, ur 15 (*)    All other components within normal limits  URINE MICROSCOPIC-ADD ON - Abnormal; Notable for the following:    Crystals CA OXALATE CRYSTALS (*)    All other components within normal limits   Imaging Review No results found.   EKG Interpretation None      MDM   Final diagnoses:  Dysmenorrhea    43 yo female with dysmenorrhea.  Pt without anemia.  She defers GYN exam at this time, wishing to f/u with her gyn on Monday.  Pain control given.    Kalman Drape, MD 03/24/13 [phone]

## 2013-04-08 ENCOUNTER — Telehealth: Payer: Self-pay

## 2013-04-08 ENCOUNTER — Ambulatory Visit: Payer: Self-pay | Admitting: Obstetrics & Gynecology

## 2013-04-08 NOTE — Telephone Encounter (Signed)
Received call from Call A Nurse whom stated that pt. Had called and wanted to inform clinic she would not make her appointment today at 20 with Dr. Harolyn Rutherford due to traffic but stated she would call tomorrow to re-schedule. Informed the nurse to inform pt. That this was OK and thank you.

## 2013-04-29 ENCOUNTER — Inpatient Hospital Stay (HOSPITAL_COMMUNITY)
Admission: AD | Admit: 2013-04-29 | Discharge: 2013-04-29 | Disposition: A | Discharge status: Home / Self Care | Payer: Self-pay | Source: Ambulatory Visit | Attending: Obstetrics & Gynecology | Admitting: Obstetrics & Gynecology

## 2013-04-29 ENCOUNTER — Encounter (HOSPITAL_COMMUNITY): Payer: Self-pay | Admitting: *Deleted

## 2013-04-29 DIAGNOSIS — N938 Other specified abnormal uterine and vaginal bleeding: Secondary | ICD-10-CM | POA: Insufficient documentation

## 2013-04-29 DIAGNOSIS — N921 Excessive and frequent menstruation with irregular cycle: Secondary | ICD-10-CM | POA: Insufficient documentation

## 2013-04-29 DIAGNOSIS — N923 Ovulation bleeding: Secondary | ICD-10-CM

## 2013-04-29 DIAGNOSIS — Z9851 Tubal ligation status: Secondary | ICD-10-CM | POA: Insufficient documentation

## 2013-04-29 DIAGNOSIS — K219 Gastro-esophageal reflux disease without esophagitis: Secondary | ICD-10-CM | POA: Insufficient documentation

## 2013-04-29 DIAGNOSIS — N949 Unspecified condition associated with female genital organs and menstrual cycle: Secondary | ICD-10-CM | POA: Insufficient documentation

## 2013-04-29 DIAGNOSIS — N898 Other specified noninflammatory disorders of vagina: Secondary | ICD-10-CM | POA: Insufficient documentation

## 2013-04-29 DIAGNOSIS — W57XXXA Bitten or stung by nonvenomous insect and other nonvenomous arthropods, initial encounter: Secondary | ICD-10-CM

## 2013-04-29 DIAGNOSIS — F17200 Nicotine dependence, unspecified, uncomplicated: Secondary | ICD-10-CM | POA: Insufficient documentation

## 2013-04-29 DIAGNOSIS — Y929 Unspecified place or not applicable: Secondary | ICD-10-CM | POA: Insufficient documentation

## 2013-04-29 LAB — URINALYSIS, ROUTINE W REFLEX MICROSCOPIC
BILIRUBIN URINE: NEGATIVE
GLUCOSE, UA: NEGATIVE mg/dL
HGB URINE DIPSTICK: NEGATIVE
KETONES UR: NEGATIVE mg/dL
Leukocytes, UA: NEGATIVE
Nitrite: NEGATIVE
Protein, ur: NEGATIVE mg/dL
Specific Gravity, Urine: 1.015 (ref 1.005–1.030)
Urobilinogen, UA: 0.2 mg/dL (ref 0.0–1.0)
pH: 6 (ref 5.0–8.0)

## 2013-04-29 LAB — WET PREP, GENITAL
Trich, Wet Prep: NONE SEEN
Yeast Wet Prep HPF POC: NONE SEEN

## 2013-04-29 LAB — POCT PREGNANCY, URINE: Preg Test, Ur: NEGATIVE

## 2013-04-29 NOTE — MAU Provider Note (Signed)
History     CSN: [id number]  Arrival date and time: 04/29/13 2038   First Provider Initiated Contact with Patient 04/29/13 2251      No chief complaint on file.  HPI  Olivia Dunn is a 43 y.o. [id number] who presents today with pink spotting on Saturday and Sunday. She has none now, and states that her LMP was 04/18/13. She also has a white vaginal discharge, and is concerned that she could have a STI. She states that she has not had intercourse in 3 months, and doesn't have a specific concern. She just would like to have testing done today.  She has a hx of fibroids and is planning on seeing someone in the GYN clinic for care. She missed her last appointment.   She is also concerned about a bug bite on her thigh. She states that her and several co-workers have all been bitten. She states that it is smaller now. She is not sure what bit her.   Past Medical History  Diagnosis Date  . Acid reflux   . Fibroids     Past Surgical History  Procedure Laterality Date  . Tubal ligation      History reviewed. No pertinent family history.  History  Substance Use Topics  . Smoking status: Current Every Day Smoker -- 0.25 packs/day    Types: Cigarettes  . Smokeless tobacco: Not on file  . Alcohol Use: Yes     Comment: occassional    Allergies:  Allergies  Allergen Reactions  . Oxycodone Itching and Nausea Only    Prescriptions prior to admission  Medication Sig Dispense Refill  . BIOTIN PO Take 1 tablet by mouth daily.      . Multiple Vitamin (MULTIVITAMIN) tablet Take 1 tablet by mouth daily.      Marland Kitchen HYDROcodone-acetaminophen (NORCO/VICODIN) 5-325 MG per tablet Take 1-2 tablets by mouth every 4 (four) hours as needed for moderate pain.  30 tablet  0  . ibuprofen (ADVIL,MOTRIN) 800 MG tablet Take 800 mg by mouth every 8 (eight) hours as needed for mild pain.        ROS Physical Exam   Blood pressure 137/73, pulse 73, temperature 98.5 F (36.9 C), temperature source  Oral, resp. rate 18, height 5\' 6"  (1.676 m), weight 75.751 kg (167 lb), last menstrual period 04/10/2013.  Physical Exam  Nursing note and vitals reviewed. Constitutional: She is oriented to person, place, and time. She appears well-developed and well-nourished. No distress.  Cardiovascular: Normal rate.   Respiratory: Effort normal.  GI: Soft. There is no tenderness.  Genitourinary:   External: no lesion Vagina: small amount of white discharge Cervix: pink, smooth, no CMT, small polyp on the cervix Uterus: NSSC Adnexa: NT   Neurological: She is alert and oriented to person, place, and time.  Skin: Skin is warm and dry.  Psychiatric: She has a normal mood and affect.   2cmx2cm slightly erythematous bug bite on the right thigh MAU Course  Procedures  Results for orders placed during the hospital encounter of 04/29/13 (from the past 24 hour(s))  URINALYSIS, ROUTINE W REFLEX MICROSCOPIC     Status: None   Collection Time    04/29/13  9:12 PM      Result Value Ref Range   Color, Urine YELLOW  YELLOW   APPearance CLEAR  CLEAR   Specific Gravity, Urine 1.015  1.005 - 1.030   pH 6.0  5.0 - 8.0   Glucose, UA NEGATIVE  NEGATIVE mg/dL  Hgb urine dipstick NEGATIVE  NEGATIVE   Bilirubin Urine NEGATIVE  NEGATIVE   Ketones, ur NEGATIVE  NEGATIVE mg/dL   Protein, ur NEGATIVE  NEGATIVE mg/dL   Urobilinogen, UA 0.2  0.0 - 1.0 mg/dL   Nitrite NEGATIVE  NEGATIVE   Leukocytes, UA NEGATIVE  NEGATIVE  POCT PREGNANCY, URINE     Status: None   Collection Time    04/29/13  9:19 PM      Result Value Ref Range   Preg Test, Ur NEGATIVE  NEGATIVE  WET PREP, GENITAL     Status: Abnormal   Collection Time    04/29/13 10:55 PM      Result Value Ref Range   Yeast Wet Prep HPF POC NONE SEEN  NONE SEEN   Trich, Wet Prep NONE SEEN  NONE SEEN   Clue Cells Wet Prep HPF POC FEW (*) NONE SEEN   WBC, Wet Prep HPF POC FEW (*) NONE SEEN     Assessment and Plan   1. Intermenstrual spotting   2. Bug  bite without infection    FU in the clinic Continue OTC hydrocortisone cream on bug bite GC/CT pending   Mathis Bud 04/29/2013, 11:08 PM

## 2013-04-29 NOTE — Discharge Instructions (Signed)

## 2013-04-29 NOTE — MAU Note (Addendum)
PT SAYS  SHE  GOT  BIT BY SOMETHING ON Friday WHILE  SHE WAS VOIDING- SAW BLACK  "BUGS"  FLYING AROUND,   THEN SAT SHE SAW PINKISH VAG D/C- ON PAD.     IN TRIAGE-  SHE HAS 3  RED AREAS  ON  RIGHT  UPPER  THIGH AND  AND RIGHT ARM. Marland Kitchen  LAST SEX-  2-1.  HAD BTL-   2004- WITH DR MCPHAIL.  Marland Kitchen    PT  HAS A TWIN SISTER- WHO ALSO GOT BIT   - SO WHILE SHE WAS HERE - SHE FOUND OUT SHE HAD TRICH.    PT SAYS  HER VAGINA IS IRRITATED.         SHE PUT HYDROCORTISONE CREAM ON  BITES.     SHE  WAS IN GYN CLINIC IN FEB-   FOR PAP SMEAR-  SHE ALSO HAS FIBROIDS.  SHE  IS SUPPOSE TO CALL IN MAY  FOR AN APPOINTMENT .

## 2013-04-30 LAB — GC/CHLAMYDIA PROBE AMP
CT PROBE, AMP APTIMA: NEGATIVE
GC PROBE AMP APTIMA: NEGATIVE

## 2013-04-30 NOTE — MAU Provider Note (Signed)
Attestation of Attending Supervision of Advanced Practitioner (CNM/NP): Evaluation and management procedures were performed by the Advanced Practitioner under my supervision and collaboration. I have reviewed the Advanced Practitioner's note and chart, and I agree with the management and plan.  Fredderick Phenix Jonatan Wilsey 7:22 AM

## 2013-11-11 ENCOUNTER — Encounter (HOSPITAL_COMMUNITY): Payer: Self-pay | Admitting: *Deleted

## 2014-01-08 ENCOUNTER — Encounter (HOSPITAL_COMMUNITY): Payer: Self-pay

## 2014-01-08 ENCOUNTER — Inpatient Hospital Stay (HOSPITAL_COMMUNITY)
Admission: AD | Admit: 2014-01-08 | Discharge: 2014-01-08 | Disposition: A | Discharge status: Home / Self Care | Payer: Self-pay | Source: Ambulatory Visit | Attending: Obstetrics & Gynecology | Admitting: Obstetrics & Gynecology

## 2014-01-08 DIAGNOSIS — F17210 Nicotine dependence, cigarettes, uncomplicated: Secondary | ICD-10-CM | POA: Insufficient documentation

## 2014-01-08 DIAGNOSIS — N841 Polyp of cervix uteri: Secondary | ICD-10-CM | POA: Insufficient documentation

## 2014-01-08 DIAGNOSIS — A5901 Trichomonal vulvovaginitis: Secondary | ICD-10-CM | POA: Insufficient documentation

## 2014-01-08 LAB — URINE MICROSCOPIC-ADD ON

## 2014-01-08 LAB — URINALYSIS, ROUTINE W REFLEX MICROSCOPIC
Bilirubin Urine: NEGATIVE
Glucose, UA: NEGATIVE mg/dL
KETONES UR: NEGATIVE mg/dL
NITRITE: NEGATIVE
Protein, ur: NEGATIVE mg/dL
Specific Gravity, Urine: 1.015 (ref 1.005–1.030)
Urobilinogen, UA: 0.2 mg/dL (ref 0.0–1.0)
pH: 7 (ref 5.0–8.0)

## 2014-01-08 LAB — WET PREP, GENITAL: Yeast Wet Prep HPF POC: NONE SEEN

## 2014-01-08 LAB — POCT PREGNANCY, URINE: Preg Test, Ur: NEGATIVE

## 2014-01-08 MED ORDER — NITROFURANTOIN MONOHYD MACRO 100 MG PO CAPS: 100.0000 mg | ORAL_CAPSULE | Freq: Two times a day (BID) | ORAL | Status: DC

## 2014-01-08 MED ORDER — METRONIDAZOLE 500 MG PO TABS
2000.0000 mg | ORAL_TABLET | Freq: Once | ORAL | Status: AC
  Administered 2014-01-08: 2000 mg via ORAL
  Filled 2014-01-08: qty 4

## 2014-01-08 NOTE — Discharge Instructions (Signed)

## 2014-01-08 NOTE — MAU Provider Note (Signed)
History     CSN: [id number]  Arrival date and time: 01/08/14 1801   First Provider Initiated Contact with Patient 01/08/14 2014      Chief Complaint  Patient presents with  . Vaginal Discharge   HPI   Pt is a 43 yo female here with vaginal discharge that started two days ago.  Discharge is white with an odor.  No lesions reported.  Pt denies vaginal bleeding with intercourse.    Past Medical History  Diagnosis Date  . Acid reflux   . Fibroids     Past Surgical History  Procedure Laterality Date  . Tubal ligation      History reviewed. No pertinent family history.  History  Substance Use Topics  . Smoking status: Current Every Day Smoker -- 0.25 packs/day    Types: Cigarettes  . Smokeless tobacco: Not on file  . Alcohol Use: Yes     Comment: occassional    Allergies:  Allergies  Allergen Reactions  . Oxycodone Itching and Nausea Only    Prescriptions prior to admission  Medication Sig Dispense Refill Last Dose  . BIOTIN PO Take 1 tablet by mouth daily.   Past Month at Unknown time  . Multiple Vitamin (MULTIVITAMIN) tablet Take 1 tablet by mouth daily.   01/07/2014 at Unknown time    Review of Systems  Gastrointestinal: Negative for abdominal pain.  Genitourinary:       Vaginal discharge  All other systems reviewed and are negative.  Physical Exam   Blood pressure 141/82, pulse 84, temperature 99.2 F (37.3 C), temperature source Oral, resp. rate 16, height 5' 7.5" (1.715 m), weight 76.93 kg (169 lb 9.6 oz), last menstrual period 12/29/2013, SpO2 100 %.  Physical Exam  Constitutional: She is oriented to person, place, and time. She appears well-developed and well-nourished. No distress.  HENT:  Head: Normocephalic.  Eyes: Pupils are equal, round, and reactive to light.  Neck: Normal range of motion. Neck supple.  Cardiovascular: Normal rate, regular rhythm and normal heart sounds.   Respiratory: Effort normal and breath sounds normal.  GI: Soft.  There is no tenderness.  Genitourinary: Vaginal discharge (white, frothy discharge) found.  Polyp seen protruding from cervical os  Musculoskeletal: Normal range of motion.  Neurological: She is alert and oriented to person, place, and time. She has normal reflexes.  Skin: Skin is warm and dry.    MAU Course  Procedures  Results for orders placed or performed during the hospital encounter of 01/08/14 (from the past 24 hour(s))  Urinalysis, Routine w reflex microscopic     Status: Abnormal   Collection Time: 01/08/14  6:25 PM  Result Value Ref Range   Color, Urine YELLOW YELLOW   APPearance CLEAR CLEAR   Specific Gravity, Urine 1.015 1.005 - 1.030   pH 7.0 5.0 - 8.0   Glucose, UA NEGATIVE NEGATIVE mg/dL   Hgb urine dipstick TRACE (A) NEGATIVE   Bilirubin Urine NEGATIVE NEGATIVE   Ketones, ur NEGATIVE NEGATIVE mg/dL   Protein, ur NEGATIVE NEGATIVE mg/dL   Urobilinogen, UA 0.2 0.0 - 1.0 mg/dL   Nitrite NEGATIVE NEGATIVE   Leukocytes, UA MODERATE (A) NEGATIVE  Urine microscopic-add on     Status: Abnormal   Collection Time: 01/08/14  6:25 PM  Result Value Ref Range   Squamous Epithelial / LPF FEW (A) RARE   WBC, UA 3-6 <3 WBC/hpf   Bacteria, UA FEW (A) RARE   Urine-Other TRICHOMONAS PRESENT   Pregnancy, urine POC  Status: None   Collection Time: 01/08/14  6:36 PM  Result Value Ref Range   Preg Test, Ur NEGATIVE NEGATIVE  Wet prep, genital     Status: Abnormal   Collection Time: 01/08/14  8:50 PM  Result Value Ref Range   Yeast Wet Prep HPF POC NONE SEEN NONE SEEN   Trich, Wet Prep MODERATE (A) NONE SEEN   Clue Cells Wet Prep HPF POC FEW (A) NONE SEEN   WBC, Wet Prep HPF POC FEW (A) NONE SEEN     Assessment and Plan  Trichomoniasis Cervical Polyp  Plan: Flagyl 2GM in MAU Advised partner treatment Follow-up at scheduled GYN appt with Dr. Garwin Brothers for polyp.  Kathrine Haddock N 01/08/2014, 8:15 PM

## 2014-01-08 NOTE — MAU Note (Signed)
Patient states she has had a vaginal discharge with a bad odor and vaginal irritation since 12-28.

## 2014-01-09 LAB — HIV ANTIBODY (ROUTINE TESTING W REFLEX): HIV 1&2 Ab, 4th Generation: NONREACTIVE

## 2014-01-09 LAB — GC/CHLAMYDIA PROBE AMP
CT Probe RNA: NEGATIVE
GC Probe RNA: NEGATIVE

## 2014-03-03 ENCOUNTER — Other Ambulatory Visit: Payer: Self-pay | Admitting: Obstetrics and Gynecology

## 2014-03-03 DIAGNOSIS — D259 Leiomyoma of uterus, unspecified: Secondary | ICD-10-CM

## 2014-03-04 ENCOUNTER — Other Ambulatory Visit: Payer: Self-pay | Admitting: Obstetrics and Gynecology

## 2014-03-04 DIAGNOSIS — D259 Leiomyoma of uterus, unspecified: Secondary | ICD-10-CM

## 2014-03-12 ENCOUNTER — Ambulatory Visit
Admission: RE | Admit: 2014-03-12 | Discharge: 2014-03-12 | Disposition: A | Discharge status: Home / Self Care | Payer: Self-pay | Source: Ambulatory Visit | Attending: Obstetrics and Gynecology | Admitting: Obstetrics and Gynecology

## 2014-03-12 DIAGNOSIS — D259 Leiomyoma of uterus, unspecified: Secondary | ICD-10-CM

## 2014-03-12 HISTORY — PX: IR GENERIC HISTORICAL: IMG1180011

## 2014-03-12 NOTE — Consult Note (Signed)
Chief Complaint: Chief Complaint  Patient presents with  . Advice Only    Consult for [place]   Dysfunctional uterine bleeding, uterine fibroids.   Referring Physician(s): Cousins,Sheronette A  History of Present Illness: Olivia Dunn is a 44 y.o. female G2 P2. Status post previous tubal ligation. She presents for evaluation of dysfunctional uterine bleeding related to uterine fibroids. Last menstrual period 02/26/2014. Menstrual cycle frequency is every 2-4 weeks. Cycle last approximate 6 days with 4 heavy days including passage of blood clots. Frequency of pad changes every 30 minutes. No inter-. Bleeding. Fibroids were originally diagnosed 2 years ago. Main complaint is heavy menstrual flow with passage of blood clots and associated lower abdominal and pelvic pain with back pain. She also has urinary frequency, bloating and constipation. She is not currently on any hormone replacement therapies of birth control pills. No prior surgeries for fibroids. Remote history of trichomonas and yeast infection. Recent endometrial biopsy was negative for malignancy or hyperplasia. Ultrasound confirms numerous small fibroids dating back to 09/11/2012.  Past Medical History  Diagnosis Date  . Acid reflux   . Fibroids     Past Surgical History  Procedure Laterality Date  . Tubal ligation      Allergies: Oxycodone  Medications: Prior to Admission medications   Medication Sig Start Date End Date Taking? Authorizing Provider  Multiple Vitamin (MULTIVITAMIN) tablet Take 1 tablet by mouth daily.   Yes Historical Provider, MD  BIOTIN PO Take 1 tablet by mouth daily.    Historical Provider, MD  nitrofurantoin, macrocrystal-monohydrate, (MACROBID) 100 MG capsule Take 1 capsule (100 mg total) by mouth 2 (two) times daily. Patient not taking: Reported on 03/12/2014 01/08/14   Gwen Pounds, CNM     No family history on file.  History   Social History  . Marital Status: Single   Spouse Name: N/A  . Number of Children: N/A  . Years of Education: N/A   Social History Main Topics  . Smoking status: Current Every Day Smoker -- 0.25 packs/day    Types: Cigarettes  . Smokeless tobacco: Not on file  . Alcohol Use: Yes     Comment: occassional  . Drug Use: Yes    Special: Marijuana  . Sexual Activity: Yes    Birth Control/ Protection: Surgical, Condom   Other Topics Concern  . None   Social History Narrative     Review of Systems: A 12 point ROS discussed and pertinent positives are indicated in the HPI above.  All other systems are negative.  Review of Systems  Constitutional: Positive for fatigue. Negative for fever, activity change, appetite change and unexpected weight change.  Respiratory: Negative for cough and chest tightness.   Cardiovascular: Positive for palpitations. Negative for chest pain.  Gastrointestinal: Negative for abdominal distention.    Vital Signs: BP 119/71 mmHg  Pulse 71  Temp(Src) 98.9 F (37.2 C) (Oral)  Resp 14  Ht 5\' 7"  (1.702 m)  Wt 167 lb (75.751 kg)  BMI 26.15 kg/m2  SpO2 99%  LMP 02/26/2014 (Approximate)  Physical Exam  Constitutional: She appears well-developed and well-nourished. No distress.  Cardiovascular: Normal rate, regular rhythm, normal heart sounds and intact distal pulses.  Exam reveals no friction rub.   No murmur heard. Pulmonary/Chest: Effort normal and breath sounds normal. No respiratory distress. She has no wheezes. She has no rales. She exhibits no tenderness.  Abdominal: Soft. Bowel sounds are normal. She exhibits no distension and no mass. There is  no tenderness.  Skin: She is not diaphoretic.  Psychiatric: She has a normal mood and affect. Her behavior is normal. Judgment and thought content normal.  Vitals reviewed.   Mallampati Score:   1  Imaging: No results found.  Labs:  CBC:  Recent Labs  03/21/13 2216  WBC 8.0  HGB 11.9*  HCT 34.9*  PLT 334    COAGS: No results  for input(s): INR, APTT in the last 8760 hours.  BMP:  Recent Labs  03/21/13 2216  NA 140  K 3.9  CL 101  CO2 25  GLUCOSE 111*  BUN 9  CALCIUM 9.2  CREATININE 0.90  GFRNONAA 77*  GFRAA 89*    LIVER FUNCTION TESTS:  Recent Labs  03/21/13 2216  BILITOT <0.2*  AST 14  ALT 11  ALKPHOS 50  PROT 7.2  ALBUMIN 4.0    Assessment and Plan:  44 year old female with systematic uterine fibroids and dysfunctional uterine bleeding with passage of blood clots. There is associated low back pain, pelvic pain and pressure, urinary frequency, constipation, and anemia.  Today, we reviewed the treatment options for uterine fibroids. Our discussion centered around uterine fibroid embolization. We reviewed the procedure, risks, benefits, alternatives, expected goals and outcomes. She understands the procedure. All questions were addressed. She would like to proceed with the workup. Pelvic MRI with and without contrast will be scheduled in the next few days. This will be reviewed to confirm her fibroid anatomy is amenable to embolization therapy.  Thank you for this interesting consult.  I greatly enjoyed meeting Inette Doubrava and look forward to participating in their care.  SignedGreggory Keen 03/12/2014, 10:23 AM   I spent a total of 40 Minutes  in face to face in clinical consultation, greater than 50% of which was counseling/coordinating care for this patient with systematic uterine fibroids.

## 2014-03-13 ENCOUNTER — Other Ambulatory Visit: Payer: Self-pay | Admitting: Internal Medicine

## 2014-03-13 DIAGNOSIS — E049 Nontoxic goiter, unspecified: Secondary | ICD-10-CM

## 2014-03-15 ENCOUNTER — Ambulatory Visit
Admission: RE | Admit: 2014-03-15 | Discharge: 2014-03-15 | Disposition: A | Discharge status: Home / Self Care | Payer: Federal, State, Local not specified - PPO | Source: Ambulatory Visit | Attending: Obstetrics and Gynecology | Admitting: Obstetrics and Gynecology

## 2014-03-15 DIAGNOSIS — D259 Leiomyoma of uterus, unspecified: Secondary | ICD-10-CM

## 2014-03-17 ENCOUNTER — Ambulatory Visit
Admission: RE | Admit: 2014-03-17 | Discharge: 2014-03-17 | Disposition: A | Discharge status: Home / Self Care | Payer: Federal, State, Local not specified - PPO | Source: Ambulatory Visit | Attending: Obstetrics and Gynecology | Admitting: Obstetrics and Gynecology

## 2014-03-17 ENCOUNTER — Ambulatory Visit (HOSPITAL_COMMUNITY)
Admission: RE | Admit: 2014-03-17 | Discharge: 2014-03-17 | Disposition: A | Discharge status: Home / Self Care | Payer: Federal, State, Local not specified - PPO | Source: Ambulatory Visit | Attending: Internal Medicine | Admitting: Internal Medicine

## 2014-03-17 ENCOUNTER — Ambulatory Visit (HOSPITAL_COMMUNITY): Admission: RE | Admit: 2014-03-17 | Payer: Federal, State, Local not specified - PPO | Source: Ambulatory Visit

## 2014-03-17 DIAGNOSIS — E049 Nontoxic goiter, unspecified: Secondary | ICD-10-CM | POA: Diagnosis present

## 2014-03-17 DIAGNOSIS — E042 Nontoxic multinodular goiter: Secondary | ICD-10-CM | POA: Diagnosis not present

## 2014-03-17 MED ORDER — GADOBENATE DIMEGLUMINE 529 MG/ML IV SOLN
15.0000 mL | Freq: Once | INTRAVENOUS | Status: AC | PRN
  Administered 2014-03-17: 15 mL via INTRAVENOUS

## 2014-03-25 ENCOUNTER — Other Ambulatory Visit: Payer: Self-pay | Admitting: Interventional Radiology

## 2014-03-25 DIAGNOSIS — D251 Intramural leiomyoma of uterus: Secondary | ICD-10-CM

## 2014-04-07 ENCOUNTER — Other Ambulatory Visit: Payer: Self-pay | Admitting: Radiology

## 2014-04-09 ENCOUNTER — Observation Stay (HOSPITAL_COMMUNITY)
Admission: RE | Admit: 2014-04-09 | Discharge: 2014-04-10 | Disposition: A | Discharge status: Home / Self Care | Payer: Federal, State, Local not specified - PPO | Source: Ambulatory Visit | Attending: Interventional Radiology | Admitting: Interventional Radiology

## 2014-04-09 ENCOUNTER — Ambulatory Visit (HOSPITAL_COMMUNITY)
Admission: RE | Admit: 2014-04-09 | Discharge: 2014-04-09 | Disposition: A | Discharge status: Home / Self Care | Payer: Federal, State, Local not specified - PPO | Source: Ambulatory Visit | Attending: Interventional Radiology | Admitting: Interventional Radiology

## 2014-04-09 ENCOUNTER — Encounter (HOSPITAL_COMMUNITY): Payer: Self-pay

## 2014-04-09 DIAGNOSIS — Z79899 Other long term (current) drug therapy: Secondary | ICD-10-CM | POA: Insufficient documentation

## 2014-04-09 DIAGNOSIS — D251 Intramural leiomyoma of uterus: Principal | ICD-10-CM | POA: Diagnosis present

## 2014-04-09 DIAGNOSIS — F17210 Nicotine dependence, cigarettes, uncomplicated: Secondary | ICD-10-CM | POA: Insufficient documentation

## 2014-04-09 LAB — CBC
HEMATOCRIT: 36 % (ref 36.0–46.0)
Hemoglobin: 11.6 g/dL — ABNORMAL LOW (ref 12.0–15.0)
MCH: 28.1 pg (ref 26.0–34.0)
MCHC: 32.2 g/dL (ref 30.0–36.0)
MCV: 87.2 fL (ref 78.0–100.0)
Platelets: 336 10*3/uL (ref 150–400)
RBC: 4.13 MIL/uL (ref 3.87–5.11)
RDW: 15.7 % — ABNORMAL HIGH (ref 11.5–15.5)
WBC: 7 10*3/uL (ref 4.0–10.5)

## 2014-04-09 LAB — BASIC METABOLIC PANEL
Anion gap: 9 (ref 5–15)
BUN: 14 mg/dL (ref 6–23)
CHLORIDE: 104 mmol/L (ref 96–112)
CO2: 23 mmol/L (ref 19–32)
Calcium: 9.3 mg/dL (ref 8.4–10.5)
Creatinine, Ser: 0.69 mg/dL (ref 0.50–1.10)
Glucose, Bld: 97 mg/dL (ref 70–99)
Potassium: 4.1 mmol/L (ref 3.5–5.1)
Sodium: 136 mmol/L (ref 135–145)

## 2014-04-09 LAB — APTT: APTT: 36 s (ref 24–37)

## 2014-04-09 LAB — PROTIME-INR
INR: 0.96 (ref 0.00–1.49)
Prothrombin Time: 12.9 seconds (ref 11.6–15.2)

## 2014-04-09 LAB — HCG, SERUM, QUALITATIVE: PREG SERUM: NEGATIVE

## 2014-04-09 MED ORDER — NALOXONE HCL 0.4 MG/ML IJ SOLN: 0.4000 mg | INTRAMUSCULAR | Status: DC | PRN

## 2014-04-09 MED ORDER — SODIUM CHLORIDE 0.9 % IJ SOLN: 3.0000 mL | INTRAMUSCULAR | Status: DC | PRN

## 2014-04-09 MED ORDER — HYDROMORPHONE HCL 2 MG/ML IJ SOLN
INTRAMUSCULAR | Status: AC
  Filled 2014-04-09: qty 1

## 2014-04-09 MED ORDER — DIPHENHYDRAMINE HCL 50 MG/ML IJ SOLN
12.5000 mg | Freq: Four times a day (QID) | INTRAMUSCULAR | Status: DC | PRN
Start: 2014-04-09 — End: 2014-04-10

## 2014-04-09 MED ORDER — MIDAZOLAM HCL 2 MG/2ML IJ SOLN
INTRAMUSCULAR | Status: AC
  Filled 2014-04-09: qty 6

## 2014-04-09 MED ORDER — ONDANSETRON HCL 4 MG/2ML IJ SOLN
4.0000 mg | Freq: Four times a day (QID) | INTRAMUSCULAR | Status: DC | PRN
  Administered 2014-04-09: 4 mg via INTRAVENOUS
  Filled 2014-04-09: qty 2

## 2014-04-09 MED ORDER — DOCUSATE SODIUM 100 MG PO CAPS
100.0000 mg | ORAL_CAPSULE | Freq: Two times a day (BID) | ORAL | Status: DC
  Administered 2014-04-09 – 2014-04-10 (×2): 100 mg via ORAL
  Filled 2014-04-09 (×3): qty 1

## 2014-04-09 MED ORDER — SODIUM CHLORIDE 0.9 % IV SOLN
INTRAVENOUS | Status: DC
  Administered 2014-04-09: 08:00:00 via INTRAVENOUS
  Administered 2014-04-10: 1000 mL via INTRAVENOUS

## 2014-04-09 MED ORDER — FENTANYL CITRATE 0.05 MG/ML IJ SOLN
INTRAMUSCULAR | Status: DC | PRN
  Administered 2014-04-09 (×2): 25 ug via INTRAVENOUS
  Administered 2014-04-09 (×3): 50 ug via INTRAVENOUS

## 2014-04-09 MED ORDER — HYDROMORPHONE 0.3 MG/ML IV SOLN
INTRAVENOUS | Status: DC
  Administered 2014-04-09: 4.99 mg via INTRAVENOUS

## 2014-04-09 MED ORDER — HYDROMORPHONE HCL 1 MG/ML IJ SOLN
INTRAMUSCULAR | Status: AC | PRN
  Administered 2014-04-09: 1 mg via INTRAVENOUS

## 2014-04-09 MED ORDER — HYDROMORPHONE 0.3 MG/ML IV SOLN
INTRAVENOUS | Status: AC
Start: 2014-04-09 — End: 2014-04-09
  Filled 2014-04-09: qty 25

## 2014-04-09 MED ORDER — SODIUM CHLORIDE 0.9 % IV SOLN: 250.0000 mL | INTRAVENOUS | Status: DC | PRN

## 2014-04-09 MED ORDER — DIPHENHYDRAMINE HCL 12.5 MG/5ML PO ELIX: 12.5000 mg | ORAL_SOLUTION | Freq: Four times a day (QID) | ORAL | Status: DC | PRN

## 2014-04-09 MED ORDER — MORPHINE SULFATE 2 MG/ML IJ SOLN
1.0000 mg | INTRAMUSCULAR | Status: DC | PRN
  Administered 2014-04-09 – 2014-04-10 (×3): 2 mg via INTRAVENOUS
  Filled 2014-04-09 (×3): qty 1

## 2014-04-09 MED ORDER — CEFAZOLIN SODIUM-DEXTROSE 2-3 GM-% IV SOLR
2.0000 g | Freq: Once | INTRAVENOUS | Status: AC
  Administered 2014-04-09: 2 g via INTRAVENOUS

## 2014-04-09 MED ORDER — PROMETHAZINE HCL 25 MG RE SUPP: 25.0000 mg | Freq: Three times a day (TID) | RECTAL | Status: DC | PRN

## 2014-04-09 MED ORDER — CEFAZOLIN SODIUM-DEXTROSE 2-3 GM-% IV SOLR
INTRAVENOUS | Status: AC
  Filled 2014-04-09: qty 50

## 2014-04-09 MED ORDER — SODIUM CHLORIDE 0.9 % IJ SOLN: 9.0000 mL | INTRAMUSCULAR | Status: DC | PRN

## 2014-04-09 MED ORDER — PROMETHAZINE HCL 25 MG PO TABS
25.0000 mg | ORAL_TABLET | Freq: Three times a day (TID) | ORAL | Status: DC | PRN
  Administered 2014-04-09: 25 mg via ORAL
  Filled 2014-04-09: qty 1

## 2014-04-09 MED ORDER — NITROGLYCERIN 1 MG/10 ML FOR IR/CATH LAB
INTRA_ARTERIAL | Status: DC | PRN
  Administered 2014-04-09: 250 ug via INTRA_ARTERIAL
  Administered 2014-04-09 (×3): 200 ug via INTRA_ARTERIAL

## 2014-04-09 MED ORDER — SODIUM CHLORIDE 0.9 % IJ SOLN: 3.0000 mL | Freq: Two times a day (BID) | INTRAMUSCULAR | Status: DC

## 2014-04-09 MED ORDER — ONDANSETRON HCL 4 MG/2ML IJ SOLN: 4.0000 mg | Freq: Four times a day (QID) | INTRAMUSCULAR | Status: DC | PRN

## 2014-04-09 MED ORDER — DIPHENHYDRAMINE HCL 12.5 MG/5ML PO ELIX
12.5000 mg | ORAL_SOLUTION | Freq: Four times a day (QID) | ORAL | Status: DC | PRN
  Filled 2014-04-09: qty 5

## 2014-04-09 MED ORDER — KETOROLAC TROMETHAMINE 30 MG/ML IJ SOLN
30.0000 mg | Freq: Four times a day (QID) | INTRAMUSCULAR | Status: DC
  Administered 2014-04-09 – 2014-04-10 (×5): 30 mg via INTRAVENOUS
  Filled 2014-04-09 (×8): qty 1

## 2014-04-09 MED ORDER — IOHEXOL 300 MG/ML  SOLN
80.0000 mL | Freq: Once | INTRAMUSCULAR | Status: AC | PRN
  Administered 2014-04-09: 1 mL via INTRA_ARTERIAL

## 2014-04-09 MED ORDER — LIDOCAINE HCL 1 % IJ SOLN
INTRAMUSCULAR | Status: AC
  Filled 2014-04-09: qty 20

## 2014-04-09 MED ORDER — FENTANYL CITRATE 0.05 MG/ML IJ SOLN
INTRAMUSCULAR | Status: AC
  Filled 2014-04-09: qty 4

## 2014-04-09 MED ORDER — KETOROLAC TROMETHAMINE 30 MG/ML IJ SOLN
30.0000 mg | Freq: Once | INTRAMUSCULAR | Status: AC
  Administered 2014-04-09: 30 mg via INTRAVENOUS
  Filled 2014-04-09: qty 1

## 2014-04-09 MED ORDER — MIDAZOLAM HCL 2 MG/2ML IJ SOLN
INTRAMUSCULAR | Status: DC | PRN
  Administered 2014-04-09 (×2): 1 mg via INTRAVENOUS
  Administered 2014-04-09: 0.5 mg via INTRAVENOUS
  Administered 2014-04-09: 1 mg via INTRAVENOUS
  Administered 2014-04-09: 0.5 mg via INTRAVENOUS
  Administered 2014-04-09: 1 mg via INTRAVENOUS
  Administered 2014-04-09 (×2): 0.5 mg via INTRAVENOUS

## 2014-04-09 MED ORDER — HYDROMORPHONE 0.3 MG/ML IV SOLN
INTRAVENOUS | Status: DC
  Administered 2014-04-09: 12:00:00 via INTRAVENOUS

## 2014-04-09 MED ORDER — ONDANSETRON HCL 4 MG/2ML IJ SOLN
4.0000 mg | Freq: Once | INTRAMUSCULAR | Status: AC
  Administered 2014-04-09: 4 mg via INTRAVENOUS
  Filled 2014-04-09: qty 2

## 2014-04-09 MED ORDER — DIPHENHYDRAMINE HCL 50 MG/ML IJ SOLN: 12.5000 mg | Freq: Four times a day (QID) | INTRAMUSCULAR | Status: DC | PRN

## 2014-04-09 NOTE — H&P (Signed)
Chief Complaint: "I'm here for my fibroid procedure"  HPI: Olivia Dunn is an 44 y.o. female with symptomatic uterine fibroids. She has seen Dr. Annamaria Boots in consult for possible fibroid embolization and is now scheudled for procedure today. See full consult for details. Pt feels well, no recent fevers, chills, illness. Has been NPO since last pm.  Past Medical History:  Past Medical History  Diagnosis Date  . Acid reflux   . Fibroids     Past Surgical History:  Past Surgical History  Procedure Laterality Date  . Tubal ligation      Family History: History reviewed. No pertinent family history.  Social History:  reports that she has been smoking Cigarettes.  She has been smoking about 0.25 packs per day. She does not have any smokeless tobacco history on file. She reports that she drinks alcohol. She reports that she uses illicit drugs (Marijuana).  Allergies:  Allergies  Allergen Reactions  . Oxycodone Itching and Nausea Only    Medications: MVI, Iron  Please HPI for pertinent positives, otherwise complete 10 system ROS negative.  Physical Exam: BP 128/81 mmHg  Pulse 67  Temp(Src) 98.1 F (36.7 C) (Oral)  Resp 16  Ht 5\' 7"  (1.702 m)  Wt 166 lb (75.297 kg)  BMI 25.99 kg/m2  SpO2 100%  LMP 03/17/2014 (Approximate) Body mass index is 25.99 kg/(m^2).   General Appearance:  Alert, cooperative, no distress, appears stated age  Head:  Normocephalic, without obvious abnormality, atraumatic  ENT: Unremarkable  Neck: Supple, symmetrical, trachea midline  Lungs:   Clear to auscultation bilaterally, no w/r/r, respirations unlabored without use of accessory muscles.  Heart:  Regular rate and rhythm, S1, S2 normal, no murmur, rub or gallop.  Abdomen:   Soft, non-tender, non distended.  Extremities: Extremities normal, atraumatic, no cyanosis or edema  Pulses: 2+ and symmetric femoral and pedal  Neurologic: Normal affect, no gross deficits.  Labs: Results for  orders placed or performed during the hospital encounter of 04/09/14 (from the past 48 hour(s))  CBC     Status: Abnormal   Collection Time: 04/09/14  8:09 AM  Result Value Ref Range   WBC 7.0 4.0 - 10.5 K/uL   RBC 4.13 3.87 - 5.11 MIL/uL   Hemoglobin 11.6 (L) 12.0 - 15.0 g/dL   HCT 36.0 36.0 - 46.0 %   MCV 87.2 78.0 - 100.0 fL   MCH 28.1 26.0 - 34.0 pg   MCHC 32.2 30.0 - 36.0 g/dL   RDW 15.7 (H) 11.5 - 15.5 %   Platelets 336 150 - 400 K/uL    Imaging: No results found.  Assessment/Plan Symptomatic uterine fibroids Plan for [place] today Labs pending Discussed procedure, risks, complications, plan for overnight observation. Consent signed in chart  Ascencion Dike PA-C 04/09/2014, 8:41 AM

## 2014-04-09 NOTE — Sedation Documentation (Signed)
5Fr sheath removed from Left femoral artery by Dr. Annamaria Boots.  Hemostasis achieved using Exoseal closure device.  Groin level 0, 2+RDP.

## 2014-04-09 NOTE — Procedures (Signed)
Successful UFE NO COMP STABLE FULL REPORT IN PACS OVERNIGHT OBS F/U 3 WEEKS AT OFFICE

## 2014-04-09 NOTE — Sedation Documentation (Signed)
5Fr sheath removed from R femoral artery by Dr. Annamaria Boots.  Hemostasis achieved using Exoseal closure device.  Groin level 0, 2+ DDP.

## 2014-04-10 ENCOUNTER — Other Ambulatory Visit: Payer: Self-pay | Admitting: Radiology

## 2014-04-10 DIAGNOSIS — D251 Intramural leiomyoma of uterus: Secondary | ICD-10-CM

## 2014-04-10 MED ORDER — ONDANSETRON HCL 4 MG/2ML IJ SOLN
4.0000 mg | Freq: Four times a day (QID) | INTRAMUSCULAR | Status: DC | PRN
Start: 2014-04-10 — End: 2014-04-10

## 2014-04-10 MED ORDER — NALOXONE HCL 0.4 MG/ML IJ SOLN
0.4000 mg | INTRAMUSCULAR | Status: DC | PRN
Start: 2014-04-10 — End: 2014-04-10

## 2014-04-10 MED ORDER — HYDROCODONE-ACETAMINOPHEN 5-325 MG PO TABS: 1.0000 | ORAL_TABLET | ORAL | Status: DC | PRN

## 2014-04-10 MED ORDER — DIPHENHYDRAMINE HCL 50 MG/ML IJ SOLN: 12.5000 mg | Freq: Four times a day (QID) | INTRAMUSCULAR | Status: DC | PRN

## 2014-04-10 MED ORDER — SODIUM CHLORIDE 0.9 % IJ SOLN: 9.0000 mL | INTRAMUSCULAR | Status: DC | PRN

## 2014-04-10 MED ORDER — MORPHINE SULFATE (PF) 1 MG/ML IV SOLN
INTRAVENOUS | Status: DC
  Administered 2014-04-10: 6 mg via INTRAVENOUS
  Administered 2014-04-10: 15 mg via INTRAVENOUS
  Administered 2014-04-10 (×2): via INTRAVENOUS
  Filled 2014-04-10 (×2): qty 25

## 2014-04-10 MED ORDER — DIPHENHYDRAMINE HCL 12.5 MG/5ML PO ELIX: 12.5000 mg | ORAL_SOLUTION | Freq: Four times a day (QID) | ORAL | Status: DC | PRN

## 2014-04-10 NOTE — Plan of Care (Addendum)
Problem: Phase I Progression Outcomes Goal: Pain controlled with appropriate interventions Outcome: Progressing Patient with lots of nausea this afternoon unable to keep anything down she eats or drinks. Has used about 5 mg of dilaudid in last 4 hours. ? If dilaudid making her nauseated. "Seems like after I hit button I feel more nauseated" per patient. Called Dr Anselm Pancoast and got Morphine prn for pain. Holding PCA Dilaudid and see if tolerates morphine.

## 2014-04-10 NOTE — Progress Notes (Signed)
Patient ID: Olivia Dunn, female   DOB: [birth year], 44 y.o.   MRN: [id number]    Referring Physician(s): Cousins,S   Subjective:  Pt feeling better this am; states N/V resolved after d/c of dilaudid PCA; has eaten yogurt this am; voiding ok; has some intermittent pelvic pain/cramping  Allergies: Oxycodone  Medications: Prior to Admission medications   Medication Sig Start Date End Date Taking? Authorizing Provider  Ergocalciferol (VITAMIN D2) 2000 UNITS TABS Take 1 tablet by mouth daily.   Yes Historical Provider, MD  IRON PO Take 1 tablet by mouth every morning.   Yes Historical Provider, MD  Multiple Vitamin (MULTIVITAMIN) tablet Take 1 tablet by mouth every morning.    Yes Historical Provider, MD  nitrofurantoin, macrocrystal-monohydrate, (MACROBID) 100 MG capsule Take 1 capsule (100 mg total) by mouth 2 (two) times daily. 01/08/14  Yes Gwen Pounds, CNM  psyllium (REGULOID) 0.52 G capsule Take 1.04 g by mouth every morning.   Yes Historical Provider, MD     Vital Signs: BP 144/87 mmHg  Pulse 80  Temp(Src) 98.4 F (36.9 C) (Oral)  Resp 16  Ht 5\' 7"  (1.702 m)  Wt 166 lb (75.297 kg)  BMI 25.99 kg/m2  SpO2 97%  LMP 03/17/2014 (Approximate)  Physical Exam pt awake/alert; chest- CTA bilat; heart- RRR; abd- soft,+BS, mild-mod tender mid pelvic region; puncture site bilat CFA clean and dry, sl tender on left, no hematomas; intact distal pulses; no edema  Imaging: Ir Angiogram Pelvis Selective Or Supraselective  04/09/2014   CLINICAL DATA:  Uterine fibroids, abnormal menstrual bleeding, pelvic pain and cramping  EXAM: UTERINE FIBROID EMBOLIZATION  Date:  3/30/20163/30/2016 12:12 pm  Radiologist:  M. Daryll Brod, MD  Guidance:  Ultrasound and fluoroscopic  FLUOROSCOPY TIME:  44 minutes 30 seconds, 5179 mGy  MEDICATIONS AND MEDICAL HISTORY: 6 mg Versed, 200 mcg fentanyl, 30 mg Toradol, 4 mg Zofran, 2 g Ancef administered within 1 hour of the procedure   ANESTHESIA/SEDATION: 1 hour 50 minutes  CONTRAST:  100 cc Omnipaque 938  COMPLICATIONS: None immediate  PROCEDURE: Informed consent was obtained from the patient following explanation of the procedure, risks, benefits and alternatives. The patient understands, agrees and consents for the procedure. All questions were addressed. A time out was performed.  Maximal barrier sterile technique utilized including caps, mask, sterile gowns, sterile gloves, large sterile drape, hand hygiene, and betadine prep.  Under sterile conditions and local anesthesia, bilateralcommon femoral artery access was performed with a micropuncture needle. Ultrasound was utilized for access. Images were obtained for documentation. Guidewire advanced followed by bilateral 5-French sheaths. A 5-French C2 catheter was utilized to select the contralateral left internal iliac artery. Selective left internal iliac angiogram was performed. The tortuous left uterine artery was identified. Selective catheterization was performed of the left uterine artery with a microcatheter and micro guide wire. A selective left uterine angiogram was performed. This demonstrated patency of the left uterine artery. Mild diffuse hypervascularity of the enlarged fibroid uterus. Access was adequate for embolization. For embolization, 1 vial of 500 - 717micron Embospheres were injected into the left uterine artery. Post embolization angiogram confirms complete stasis of the left uterine vascular territory. Microcatheter was removed.  A second C2 catheter was utilized to select the right internal iliac artery. Selective right internal iliac angiogram was performed. The patent right uterine artery was identified. For selective catheterization, the micro catheter and guidewire were utilized to select the right uterine artery. Selective right uterine angiogram was performed. This demonstrated patency  of the right uterine artery. Catheter position was safe for embolization.  Embolization was performed to complete stasis with injection of 1 and 1/3 vials of 500-700 micron Embospheres. Post embolization angiogram confirms complete stasis of the right uterine vascular territory.  Next, a pigtail catheter was advanced over guidewire to the level of the renal arteries. Flush aortogram performed to evaluate for ovarian supply to the uterus. Ovarian arteries are demonstrated bilaterally off of the infrarenal aorta and with delayed imaging eventually demonstrate collateral flow to the uterine vasculature. Ovarian arteries are not enlarged. Because the ovarian arteries were not significantly enlarged and the fibroid volume is small, embolization of the ovarian arteries was not performed today. Based on clinical response, ovarian embolization may be necessary in the future.  Injection of bothcommon femoral artery sheaths confirms access is adequate for Starclose. Hemostasis was obtained with a 6-French Starclose device. No immediate complication. Peripheral pedal pulses are normal +2.  The patient tolerated the procedure well. No immediate complication.  IMPRESSION: Successful bilateral uterine artery embolization ([facility])   Electronically Signed   By: Jerilynn Mages.  Shick M.D.   On: 04/09/2014 12:41   Ir Angiogram Pelvis Selective Or Supraselective  04/09/2014   CLINICAL DATA:  Uterine fibroids, abnormal menstrual bleeding, pelvic pain and cramping  EXAM: UTERINE FIBROID EMBOLIZATION  Date:  3/30/20163/30/2016 12:12 pm  Radiologist:  M. Daryll Brod, MD  Guidance:  Ultrasound and fluoroscopic  FLUOROSCOPY TIME:  44 minutes 30 seconds, 5179 mGy  MEDICATIONS AND MEDICAL HISTORY: 6 mg Versed, 200 mcg fentanyl, 30 mg Toradol, 4 mg Zofran, 2 g Ancef administered within 1 hour of the procedure  ANESTHESIA/SEDATION: 1 hour 50 minutes  CONTRAST:  100 cc Omnipaque 630  COMPLICATIONS: None immediate  PROCEDURE: Informed consent was obtained from the patient following explanation of the procedure, risks, benefits  and alternatives. The patient understands, agrees and consents for the procedure. All questions were addressed. A time out was performed.  Maximal barrier sterile technique utilized including caps, mask, sterile gowns, sterile gloves, large sterile drape, hand hygiene, and betadine prep.  Under sterile conditions and local anesthesia, bilateralcommon femoral artery access was performed with a micropuncture needle. Ultrasound was utilized for access. Images were obtained for documentation. Guidewire advanced followed by bilateral 5-French sheaths. A 5-French C2 catheter was utilized to select the contralateral left internal iliac artery. Selective left internal iliac angiogram was performed. The tortuous left uterine artery was identified. Selective catheterization was performed of the left uterine artery with a microcatheter and micro guide wire. A selective left uterine angiogram was performed. This demonstrated patency of the left uterine artery. Mild diffuse hypervascularity of the enlarged fibroid uterus. Access was adequate for embolization. For embolization, 1 vial of 500 - 733micron Embospheres were injected into the left uterine artery. Post embolization angiogram confirms complete stasis of the left uterine vascular territory. Microcatheter was removed.  A second C2 catheter was utilized to select the right internal iliac artery. Selective right internal iliac angiogram was performed. The patent right uterine artery was identified. For selective catheterization, the micro catheter and guidewire were utilized to select the right uterine artery. Selective right uterine angiogram was performed. This demonstrated patency of the right uterine artery. Catheter position was safe for embolization. Embolization was performed to complete stasis with injection of 1 and 1/3 vials of 500-700 micron Embospheres. Post embolization angiogram confirms complete stasis of the right uterine vascular territory.  Next, a pigtail  catheter was advanced over guidewire to the level of  the renal arteries. Flush aortogram performed to evaluate for ovarian supply to the uterus. Ovarian arteries are demonstrated bilaterally off of the infrarenal aorta and with delayed imaging eventually demonstrate collateral flow to the uterine vasculature. Ovarian arteries are not enlarged. Because the ovarian arteries were not significantly enlarged and the fibroid volume is small, embolization of the ovarian arteries was not performed today. Based on clinical response, ovarian embolization may be necessary in the future.  Injection of bothcommon femoral artery sheaths confirms access is adequate for Starclose. Hemostasis was obtained with a 6-French Starclose device. No immediate complication. Peripheral pedal pulses are normal +2.  The patient tolerated the procedure well. No immediate complication.  IMPRESSION: Successful bilateral uterine artery embolization ([facility] E)   Electronically Signed   By: Jerilynn Mages.  Shick M.D.   On: 04/09/2014 12:41   Ir Angiogram Selective Each Additional Vessel  04/09/2014   CLINICAL DATA:  Uterine fibroids, abnormal menstrual bleeding, pelvic pain and cramping  EXAM: UTERINE FIBROID EMBOLIZATION  Date:  3/30/20163/30/2016 12:12 pm  Radiologist:  M. Daryll Brod, MD  Guidance:  Ultrasound and fluoroscopic  FLUOROSCOPY TIME:  44 minutes 30 seconds, 5179 mGy  MEDICATIONS AND MEDICAL HISTORY: 6 mg Versed, 200 mcg fentanyl, 30 mg Toradol, 4 mg Zofran, 2 g Ancef administered within 1 hour of the procedure  ANESTHESIA/SEDATION: 1 hour 50 minutes  CONTRAST:  100 cc Omnipaque 417  COMPLICATIONS: None immediate  PROCEDURE: Informed consent was obtained from the patient following explanation of the procedure, risks, benefits and alternatives. The patient understands, agrees and consents for the procedure. All questions were addressed. A time out was performed.  Maximal barrier sterile technique utilized including caps, mask, sterile gowns,  sterile gloves, large sterile drape, hand hygiene, and betadine prep.  Under sterile conditions and local anesthesia, bilateralcommon femoral artery access was performed with a micropuncture needle. Ultrasound was utilized for access. Images were obtained for documentation. Guidewire advanced followed by bilateral 5-French sheaths. A 5-French C2 catheter was utilized to select the contralateral left internal iliac artery. Selective left internal iliac angiogram was performed. The tortuous left uterine artery was identified. Selective catheterization was performed of the left uterine artery with a microcatheter and micro guide wire. A selective left uterine angiogram was performed. This demonstrated patency of the left uterine artery. Mild diffuse hypervascularity of the enlarged fibroid uterus. Access was adequate for embolization. For embolization, 1 vial of 500 - 775micron Embospheres were injected into the left uterine artery. Post embolization angiogram confirms complete stasis of the left uterine vascular territory. Microcatheter was removed.  A second C2 catheter was utilized to select the right internal iliac artery. Selective right internal iliac angiogram was performed. The patent right uterine artery was identified. For selective catheterization, the micro catheter and guidewire were utilized to select the right uterine artery. Selective right uterine angiogram was performed. This demonstrated patency of the right uterine artery. Catheter position was safe for embolization. Embolization was performed to complete stasis with injection of 1 and 1/3 vials of 500-700 micron Embospheres. Post embolization angiogram confirms complete stasis of the right uterine vascular territory.  Next, a pigtail catheter was advanced over guidewire to the level of the renal arteries. Flush aortogram performed to evaluate for ovarian supply to the uterus. Ovarian arteries are demonstrated bilaterally off of the infrarenal aorta  and with delayed imaging eventually demonstrate collateral flow to the uterine vasculature. Ovarian arteries are not enlarged. Because the ovarian arteries were not significantly enlarged and the fibroid volume is  small, embolization of the ovarian arteries was not performed today. Based on clinical response, ovarian embolization may be necessary in the future.  Injection of bothcommon femoral artery sheaths confirms access is adequate for Starclose. Hemostasis was obtained with a 6-French Starclose device. No immediate complication. Peripheral pedal pulses are normal +2.  The patient tolerated the procedure well. No immediate complication.  IMPRESSION: Successful bilateral uterine artery embolization (U [facility] E)   Electronically Signed   By: Jerilynn Mages.  Shick M.D.   On: 04/09/2014 12:41   Ir Angiogram Selective Each Additional Vessel  04/09/2014   CLINICAL DATA:  Uterine fibroids, abnormal menstrual bleeding, pelvic pain and cramping  EXAM: UTERINE FIBROID EMBOLIZATION  Date:  3/30/20163/30/2016 12:12 pm  Radiologist:  M. Daryll Brod, MD  Guidance:  Ultrasound and fluoroscopic  FLUOROSCOPY TIME:  44 minutes 30 seconds, 5179 mGy  MEDICATIONS AND MEDICAL HISTORY: 6 mg Versed, 200 mcg fentanyl, 30 mg Toradol, 4 mg Zofran, 2 g Ancef administered within 1 hour of the procedure  ANESTHESIA/SEDATION: 1 hour 50 minutes  CONTRAST:  100 cc Omnipaque 423  COMPLICATIONS: None immediate  PROCEDURE: Informed consent was obtained from the patient following explanation of the procedure, risks, benefits and alternatives. The patient understands, agrees and consents for the procedure. All questions were addressed. A time out was performed.  Maximal barrier sterile technique utilized including caps, mask, sterile gowns, sterile gloves, large sterile drape, hand hygiene, and betadine prep.  Under sterile conditions and local anesthesia, bilateralcommon femoral artery access was performed with a micropuncture needle. Ultrasound was utilized  for access. Images were obtained for documentation. Guidewire advanced followed by bilateral 5-French sheaths. A 5-French C2 catheter was utilized to select the contralateral left internal iliac artery. Selective left internal iliac angiogram was performed. The tortuous left uterine artery was identified. Selective catheterization was performed of the left uterine artery with a microcatheter and micro guide wire. A selective left uterine angiogram was performed. This demonstrated patency of the left uterine artery. Mild diffuse hypervascularity of the enlarged fibroid uterus. Access was adequate for embolization. For embolization, 1 vial of 500 - 721micron Embospheres were injected into the left uterine artery. Post embolization angiogram confirms complete stasis of the left uterine vascular territory. Microcatheter was removed.  A second C2 catheter was utilized to select the right internal iliac artery. Selective right internal iliac angiogram was performed. The patent right uterine artery was identified. For selective catheterization, the micro catheter and guidewire were utilized to select the right uterine artery. Selective right uterine angiogram was performed. This demonstrated patency of the right uterine artery. Catheter position was safe for embolization. Embolization was performed to complete stasis with injection of 1 and 1/3 vials of 500-700 micron Embospheres. Post embolization angiogram confirms complete stasis of the right uterine vascular territory.  Next, a pigtail catheter was advanced over guidewire to the level of the renal arteries. Flush aortogram performed to evaluate for ovarian supply to the uterus. Ovarian arteries are demonstrated bilaterally off of the infrarenal aorta and with delayed imaging eventually demonstrate collateral flow to the uterine vasculature. Ovarian arteries are not enlarged. Because the ovarian arteries were not significantly enlarged and the fibroid volume is small,  embolization of the ovarian arteries was not performed today. Based on clinical response, ovarian embolization may be necessary in the future.  Injection of bothcommon femoral artery sheaths confirms access is adequate for Starclose. Hemostasis was obtained with a 6-French Starclose device. No immediate complication. Peripheral pedal pulses are normal +2.  The  patient tolerated the procedure well. No immediate complication.  IMPRESSION: Successful bilateral uterine artery embolization (U [facility] E)   Electronically Signed   By: Jerilynn Mages.  Shick M.D.   On: 04/09/2014 12:41   Ir US Guide Vasc Access Right  04/09/2014   CLINICAL DATA:  Uterine fibroids, abnormal menstrual bleeding, pelvic pain and cramping  EXAM: UTERINE FIBROID EMBOLIZATION  Date:  3/30/20163/30/2016 12:12 pm  Radiologist:  M. Daryll Brod, MD  Guidance:  Ultrasound and fluoroscopic  FLUOROSCOPY TIME:  44 minutes 30 seconds, 5179 mGy  MEDICATIONS AND MEDICAL HISTORY: 6 mg Versed, 200 mcg fentanyl, 30 mg Toradol, 4 mg Zofran, 2 g Ancef administered within 1 hour of the procedure  ANESTHESIA/SEDATION: 1 hour 50 minutes  CONTRAST:  100 cc Omnipaque 485  COMPLICATIONS: None immediate  PROCEDURE: Informed consent was obtained from the patient following explanation of the procedure, risks, benefits and alternatives. The patient understands, agrees and consents for the procedure. All questions were addressed. A time out was performed.  Maximal barrier sterile technique utilized including caps, mask, sterile gowns, sterile gloves, large sterile drape, hand hygiene, and betadine prep.  Under sterile conditions and local anesthesia, bilateralcommon femoral artery access was performed with a micropuncture needle. Ultrasound was utilized for access. Images were obtained for documentation. Guidewire advanced followed by bilateral 5-French sheaths. A 5-French C2 catheter was utilized to select the contralateral left internal iliac artery. Selective left internal iliac  angiogram was performed. The tortuous left uterine artery was identified. Selective catheterization was performed of the left uterine artery with a microcatheter and micro guide wire. A selective left uterine angiogram was performed. This demonstrated patency of the left uterine artery. Mild diffuse hypervascularity of the enlarged fibroid uterus. Access was adequate for embolization. For embolization, 1 vial of 500 - 788micron Embospheres were injected into the left uterine artery. Post embolization angiogram confirms complete stasis of the left uterine vascular territory. Microcatheter was removed.  A second C2 catheter was utilized to select the right internal iliac artery. Selective right internal iliac angiogram was performed. The patent right uterine artery was identified. For selective catheterization, the micro catheter and guidewire were utilized to select the right uterine artery. Selective right uterine angiogram was performed. This demonstrated patency of the right uterine artery. Catheter position was safe for embolization. Embolization was performed to complete stasis with injection of 1 and 1/3 vials of 500-700 micron Embospheres. Post embolization angiogram confirms complete stasis of the right uterine vascular territory.  Next, a pigtail catheter was advanced over guidewire to the level of the renal arteries. Flush aortogram performed to evaluate for ovarian supply to the uterus. Ovarian arteries are demonstrated bilaterally off of the infrarenal aorta and with delayed imaging eventually demonstrate collateral flow to the uterine vasculature. Ovarian arteries are not enlarged. Because the ovarian arteries were not significantly enlarged and the fibroid volume is small, embolization of the ovarian arteries was not performed today. Based on clinical response, ovarian embolization may be necessary in the future.  Injection of bothcommon femoral artery sheaths confirms access is adequate for Starclose.  Hemostasis was obtained with a 6-French Starclose device. No immediate complication. Peripheral pedal pulses are normal +2.  The patient tolerated the procedure well. No immediate complication.  IMPRESSION: Successful bilateral uterine artery embolization (U F E)   Electronically Signed   By: Jerilynn Mages.  Shick M.D.   On: 04/09/2014 12:41   Ir Embo Tumor Organ Ischemia Infarct Inc Guide Roadmapping  04/09/2014   CLINICAL DATA:  Uterine fibroids,  abnormal menstrual bleeding, pelvic pain and cramping  EXAM: UTERINE FIBROID EMBOLIZATION  Date:  3/30/20163/30/2016 12:12 pm  Radiologist:  M. Daryll Brod, MD  Guidance:  Ultrasound and fluoroscopic  FLUOROSCOPY TIME:  44 minutes 30 seconds, 5179 mGy  MEDICATIONS AND MEDICAL HISTORY: 6 mg Versed, 200 mcg fentanyl, 30 mg Toradol, 4 mg Zofran, 2 g Ancef administered within 1 hour of the procedure  ANESTHESIA/SEDATION: 1 hour 50 minutes  CONTRAST:  100 cc Omnipaque 374  COMPLICATIONS: None immediate  PROCEDURE: Informed consent was obtained from the patient following explanation of the procedure, risks, benefits and alternatives. The patient understands, agrees and consents for the procedure. All questions were addressed. A time out was performed.  Maximal barrier sterile technique utilized including caps, mask, sterile gowns, sterile gloves, large sterile drape, hand hygiene, and betadine prep.  Under sterile conditions and local anesthesia, bilateralcommon femoral artery access was performed with a micropuncture needle. Ultrasound was utilized for access. Images were obtained for documentation. Guidewire advanced followed by bilateral 5-French sheaths. A 5-French C2 catheter was utilized to select the contralateral left internal iliac artery. Selective left internal iliac angiogram was performed. The tortuous left uterine artery was identified. Selective catheterization was performed of the left uterine artery with a microcatheter and micro guide wire. A selective left uterine  angiogram was performed. This demonstrated patency of the left uterine artery. Mild diffuse hypervascularity of the enlarged fibroid uterus. Access was adequate for embolization. For embolization, 1 vial of 500 - 775micron Embospheres were injected into the left uterine artery. Post embolization angiogram confirms complete stasis of the left uterine vascular territory. Microcatheter was removed.  A second C2 catheter was utilized to select the right internal iliac artery. Selective right internal iliac angiogram was performed. The patent right uterine artery was identified. For selective catheterization, the micro catheter and guidewire were utilized to select the right uterine artery. Selective right uterine angiogram was performed. This demonstrated patency of the right uterine artery. Catheter position was safe for embolization. Embolization was performed to complete stasis with injection of 1 and 1/3 vials of 500-700 micron Embospheres. Post embolization angiogram confirms complete stasis of the right uterine vascular territory.  Next, a pigtail catheter was advanced over guidewire to the level of the renal arteries. Flush aortogram performed to evaluate for ovarian supply to the uterus. Ovarian arteries are demonstrated bilaterally off of the infrarenal aorta and with delayed imaging eventually demonstrate collateral flow to the uterine vasculature. Ovarian arteries are not enlarged. Because the ovarian arteries were not significantly enlarged and the fibroid volume is small, embolization of the ovarian arteries was not performed today. Based on clinical response, ovarian embolization may be necessary in the future.  Injection of bothcommon femoral artery sheaths confirms access is adequate for Starclose. Hemostasis was obtained with a 6-French Starclose device. No immediate complication. Peripheral pedal pulses are normal +2.  The patient tolerated the procedure well. No immediate complication.  IMPRESSION:  Successful bilateral uterine artery embolization (U [facility] E)   Electronically Signed   By: Jerilynn Mages.  Shick M.D.   On: 04/09/2014 12:41    Labs:  CBC:  Recent Labs  04/09/14 0809  WBC 7.0  HGB 11.6*  HCT 36.0  PLT 336    COAGS:  Recent Labs  04/09/14 0809  INR 0.96  APTT 36    BMP:  Recent Labs  04/09/14 0809  NA 136  K 4.1  CL 104  CO2 23  GLUCOSE 97  BUN 14  CALCIUM 9.3  CREATININE 0.69  GFRNONAA >90  GFRAA >90    LIVER FUNCTION TESTS: No results for input(s): BILITOT, AST, ALT, ALKPHOS, PROT, ALBUMIN in the last 8760 hours.  Assessment and Plan: S/p bilat [place] secondary to symptomatic uterine fibroids 3/30; d/c morphine PCA; convert to oral narcotic for pain; ambulate; tent plan for d/c home later this evening; f/u with Dr. Annamaria Boots in [place] [facility] in 3 weeks   Signed: Autumn Messing 04/10/2014, 9:37 AM   I spent a total of 15 minutes  in face to face in clinical consultation/evaluation, greater than 50% of which was counseling/coordinating care for uterine fibroid embolization

## 2014-04-10 NOTE — Plan of Care (Signed)
Problem: Phase I Progression Outcomes Goal: OOB as tolerated unless otherwise ordered Outcome: Progressing oob with supervison after ordered time of bedrest

## 2014-04-10 NOTE — Plan of Care (Signed)
Problem: Phase I Progression Outcomes Goal: Pain controlled with appropriate interventions Outcome: Progressing Has been given morphine 2 mg without any nausea complaints. Pain has decreased. Called Dr Anselm Pancoast and got morphine PCA ordered. Will start.

## 2014-04-10 NOTE — Discharge Instructions (Signed)
Uterine Artery Embolization for Fibroids, Care After Refer to this sheet in the next few weeks. These instructions provide you with information on caring for yourself after your procedure. Your health care provider may also give you more specific instructions. Your treatment has been planned according to current medical practices, but problems sometimes occur. Call your health care provider if you have any problems or questions after your procedure. WHAT TO EXPECT AFTER THE PROCEDURE After your procedure, it is typical to have cramping in the pelvis. You will be given pain medicine to control it. HOME CARE INSTRUCTIONS  Only take over-the-counter or prescription medicines for pain, discomfort, or fever as directed by your health care provider.  Do not take aspirin. It can cause bleeding.  Follow your health care provider's advice regarding medicines given to you, diet, activity, and when to begin sexual activity.  See your health care provider for follow-up care as directed. SEEK MEDICAL CARE IF:  You have a fever.  You have redness, swelling, and pain around your incision site.  You have pus draining from your incision.  You have a rash. SEEK IMMEDIATE MEDICAL CARE IF:  You have bleeding from your incision site.  You have difficulty breathing.  You have chest pain.  You have severe abdominal pain.  You have leg pain.  You become dizzy and faint. Document Released: 10/17/2012 Document Reviewed: 10/17/2012 [facility] Patient Information 2015 [place], [place]. This information is not intended to replace advice given to you by your health care provider. Make sure you discuss any questions you have with your health care provider.

## 2014-04-10 NOTE — Progress Notes (Signed)
UR completed 

## 2014-04-10 NOTE — Progress Notes (Signed)
Patient pain 5/10 this am. Only amb to bathroom. Reports she will "walk later". Has not tried to eat nor drink since late yesterday evening.

## 2014-04-10 NOTE — Discharge Summary (Signed)
Patient ID: Ania Levay MRN: [id number] DOB/AGE: 46-Sep-1972 46 y.o.  Admit date: 04/09/2014 Discharge date: 04/10/2014  Admission Diagnoses: Symptomatic uterine fibroids  Discharge Diagnoses: Symptomatic uterine fibroids, status post successful bilateral uterine artery embolization on 04/09/14. Active Problems:   Fibroids, intramural  Past Medical History  Diagnosis Date  . Acid reflux   . Fibroids    Past Surgical History  Procedure Laterality Date  . Tubal ligation       Discharged Condition: good  Hospital Course: Mrs. Swift is a 44 year old black female, patient of Dr. Servando Salina, who was referred to the interventional radiology service for further evaluation and possible treatment of symptomatic uterine fibroids. Mrs. Weiss has a history of dysfunctional uterine bleeding with passage of blood clots as well as associated low back pain, pelvic pain and pressure, urinary frequency, constipation and anemia. She was recently evaluated by Dr. Annamaria Boots on 03/12/14 and deemed a suitable candidate for uterine fibroid embolization. On 04/09/14 the patient underwent successful bilateral uterine artery embolization via IV conscious sedation. She tolerated the procedure well and was admitted for overnight observation for pain control. She did experience some nausea and vomiting with Dilaudid PCA and was subsequently switched to morphine PCA with resolution of symptoms. On the day of discharge she was doing remarkably well with her only significant complaint being mild intermittent pelvic cramping. She was able to tolerate her diet, ambulate and void without difficulty. She was seen by Dr. Earleen Newport and deemed stable for discharge at this time. She was given prescriptions for ibuprofen/Motrin, additional Colace, Phenergan as well as Vicodin 5/325, #30, no refills, patient to take 1-2 tablets every 4-6 hours as needed for pain. She will continue with her current home medications  and follow up with Dr. Annamaria Boots in the interventional radiology clinic in approximately 3 weeks. She was told to call our service with any questions or concerns.  Consults: none  Significant Diagnostic Studies:  Results for orders placed or performed during the hospital encounter of 04/09/14  APTT  Result Value Ref Range   aPTT 36 24 - 37 seconds  Basic metabolic panel  Result Value Ref Range   Sodium 136 135 - 145 mmol/L   Potassium 4.1 3.5 - 5.1 mmol/L   Chloride 104 96 - 112 mmol/L   CO2 23 19 - 32 mmol/L   Glucose, Bld 97 70 - 99 mg/dL   BUN 14 6 - 23 mg/dL   Creatinine, Ser 0.69 0.50 - 1.10 mg/dL   Calcium 9.3 8.4 - 10.5 mg/dL   GFR calc non Af Amer >90 >90 mL/min   GFR calc Af Amer >90 >90 mL/min   Anion gap 9 5 - 15  CBC  Result Value Ref Range   WBC 7.0 4.0 - 10.5 K/uL   RBC 4.13 3.87 - 5.11 MIL/uL   Hemoglobin 11.6 (L) 12.0 - 15.0 g/dL   HCT 36.0 36.0 - 46.0 %   MCV 87.2 78.0 - 100.0 fL   MCH 28.1 26.0 - 34.0 pg   MCHC 32.2 30.0 - 36.0 g/dL   RDW 15.7 (H) 11.5 - 15.5 %   Platelets 336 150 - 400 K/uL  hCG, serum, qualitative  Result Value Ref Range   Preg, Serum NEGATIVE NEGATIVE  Protime-INR  Result Value Ref Range   Prothrombin Time 12.9 11.6 - 15.2 seconds   INR 0.96 0.00 - 1.49     Treatments: Ir Angiogram Pelvis Selective Or Supraselective  04/09/2014   CLINICAL DATA:  Uterine fibroids, abnormal menstrual bleeding, pelvic pain and cramping  EXAM: UTERINE FIBROID EMBOLIZATION  Date:  3/30/20163/30/2016 12:12 pm  Radiologist:  M. Daryll Brod, MD  Guidance:  Ultrasound and fluoroscopic  FLUOROSCOPY TIME:  44 minutes 30 seconds, 5179 mGy  MEDICATIONS AND MEDICAL HISTORY: 6 mg Versed, 200 mcg fentanyl, 30 mg Toradol, 4 mg Zofran, 2 g Ancef administered within 1 hour of the procedure  ANESTHESIA/SEDATION: 1 hour 50 minutes  CONTRAST:  100 cc Omnipaque 573  COMPLICATIONS: None immediate  PROCEDURE: Informed consent was obtained from the patient following explanation of  the procedure, risks, benefits and alternatives. The patient understands, agrees and consents for the procedure. All questions were addressed. A time out was performed.  Maximal barrier sterile technique utilized including caps, mask, sterile gowns, sterile gloves, large sterile drape, hand hygiene, and betadine prep.  Under sterile conditions and local anesthesia, bilateralcommon femoral artery access was performed with a micropuncture needle. Ultrasound was utilized for access. Images were obtained for documentation. Guidewire advanced followed by bilateral 5-French sheaths. A 5-French C2 catheter was utilized to select the contralateral left internal iliac artery. Selective left internal iliac angiogram was performed. The tortuous left uterine artery was identified. Selective catheterization was performed of the left uterine artery with a microcatheter and micro guide wire. A selective left uterine angiogram was performed. This demonstrated patency of the left uterine artery. Mild diffuse hypervascularity of the enlarged fibroid uterus. Access was adequate for embolization. For embolization, 1 vial of 500 - 759micron Embospheres were injected into the left uterine artery. Post embolization angiogram confirms complete stasis of the left uterine vascular territory. Microcatheter was removed.  A second C2 catheter was utilized to select the right internal iliac artery. Selective right internal iliac angiogram was performed. The patent right uterine artery was identified. For selective catheterization, the micro catheter and guidewire were utilized to select the right uterine artery. Selective right uterine angiogram was performed. This demonstrated patency of the right uterine artery. Catheter position was safe for embolization. Embolization was performed to complete stasis with injection of 1 and 1/3 vials of 500-700 micron Embospheres. Post embolization angiogram confirms complete stasis of the right uterine  vascular territory.  Next, a pigtail catheter was advanced over guidewire to the level of the renal arteries. Flush aortogram performed to evaluate for ovarian supply to the uterus. Ovarian arteries are demonstrated bilaterally off of the infrarenal aorta and with delayed imaging eventually demonstrate collateral flow to the uterine vasculature. Ovarian arteries are not enlarged. Because the ovarian arteries were not significantly enlarged and the fibroid volume is small, embolization of the ovarian arteries was not performed today. Based on clinical response, ovarian embolization may be necessary in the future.  Injection of bothcommon femoral artery sheaths confirms access is adequate for Starclose. Hemostasis was obtained with a 6-French Starclose device. No immediate complication. Peripheral pedal pulses are normal +2.  The patient tolerated the procedure well. No immediate complication.  IMPRESSION: Successful bilateral uterine artery embolization ([facility])   Electronically Signed   By: Jerilynn Mages.  Shick M.D.   On: 04/09/2014 12:41   Ir Angiogram Pelvis Selective Or Supraselective  04/09/2014   CLINICAL DATA:  Uterine fibroids, abnormal menstrual bleeding, pelvic pain and cramping  EXAM: UTERINE FIBROID EMBOLIZATION  Date:  3/30/20163/30/2016 12:12 pm  Radiologist:  M. Daryll Brod, MD  Guidance:  Ultrasound and fluoroscopic  FLUOROSCOPY TIME:  44 minutes 30 seconds, 5179 mGy  MEDICATIONS AND MEDICAL HISTORY: 6 mg Versed, 200 mcg fentanyl,  30 mg Toradol, 4 mg Zofran, 2 g Ancef administered within 1 hour of the procedure  ANESTHESIA/SEDATION: 1 hour 50 minutes  CONTRAST:  100 cc Omnipaque 741  COMPLICATIONS: None immediate  PROCEDURE: Informed consent was obtained from the patient following explanation of the procedure, risks, benefits and alternatives. The patient understands, agrees and consents for the procedure. All questions were addressed. A time out was performed.  Maximal barrier sterile technique utilized  including caps, mask, sterile gowns, sterile gloves, large sterile drape, hand hygiene, and betadine prep.  Under sterile conditions and local anesthesia, bilateralcommon femoral artery access was performed with a micropuncture needle. Ultrasound was utilized for access. Images were obtained for documentation. Guidewire advanced followed by bilateral 5-French sheaths. A 5-French C2 catheter was utilized to select the contralateral left internal iliac artery. Selective left internal iliac angiogram was performed. The tortuous left uterine artery was identified. Selective catheterization was performed of the left uterine artery with a microcatheter and micro guide wire. A selective left uterine angiogram was performed. This demonstrated patency of the left uterine artery. Mild diffuse hypervascularity of the enlarged fibroid uterus. Access was adequate for embolization. For embolization, 1 vial of 500 - 723micron Embospheres were injected into the left uterine artery. Post embolization angiogram confirms complete stasis of the left uterine vascular territory. Microcatheter was removed.  A second C2 catheter was utilized to select the right internal iliac artery. Selective right internal iliac angiogram was performed. The patent right uterine artery was identified. For selective catheterization, the micro catheter and guidewire were utilized to select the right uterine artery. Selective right uterine angiogram was performed. This demonstrated patency of the right uterine artery. Catheter position was safe for embolization. Embolization was performed to complete stasis with injection of 1 and 1/3 vials of 500-700 micron Embospheres. Post embolization angiogram confirms complete stasis of the right uterine vascular territory.  Next, a pigtail catheter was advanced over guidewire to the level of the renal arteries. Flush aortogram performed to evaluate for ovarian supply to the uterus. Ovarian arteries are demonstrated  bilaterally off of the infrarenal aorta and with delayed imaging eventually demonstrate collateral flow to the uterine vasculature. Ovarian arteries are not enlarged. Because the ovarian arteries were not significantly enlarged and the fibroid volume is small, embolization of the ovarian arteries was not performed today. Based on clinical response, ovarian embolization may be necessary in the future.  Injection of bothcommon femoral artery sheaths confirms access is adequate for Starclose. Hemostasis was obtained with a 6-French Starclose device. No immediate complication. Peripheral pedal pulses are normal +2.  The patient tolerated the procedure well. No immediate complication.  IMPRESSION: Successful bilateral uterine artery embolization ([facility])   Electronically Signed   By: Jerilynn Mages.  Shick M.D.   On: 04/09/2014 12:41   Ir Angiogram Selective Each Additional Vessel  04/09/2014   CLINICAL DATA:  Uterine fibroids, abnormal menstrual bleeding, pelvic pain and cramping  EXAM: UTERINE FIBROID EMBOLIZATION  Date:  3/30/20163/30/2016 12:12 pm  Radiologist:  M. Daryll Brod, MD  Guidance:  Ultrasound and fluoroscopic  FLUOROSCOPY TIME:  44 minutes 30 seconds, 5179 mGy  MEDICATIONS AND MEDICAL HISTORY: 6 mg Versed, 200 mcg fentanyl, 30 mg Toradol, 4 mg Zofran, 2 g Ancef administered within 1 hour of the procedure  ANESTHESIA/SEDATION: 1 hour 50 minutes  CONTRAST:  100 cc Omnipaque 638  COMPLICATIONS: None immediate  PROCEDURE: Informed consent was obtained from the patient following explanation of the procedure, risks, benefits and alternatives. The patient understands,  agrees and consents for the procedure. All questions were addressed. A time out was performed.  Maximal barrier sterile technique utilized including caps, mask, sterile gowns, sterile gloves, large sterile drape, hand hygiene, and betadine prep.  Under sterile conditions and local anesthesia, bilateralcommon femoral artery access was performed with a  micropuncture needle. Ultrasound was utilized for access. Images were obtained for documentation. Guidewire advanced followed by bilateral 5-French sheaths. A 5-French C2 catheter was utilized to select the contralateral left internal iliac artery. Selective left internal iliac angiogram was performed. The tortuous left uterine artery was identified. Selective catheterization was performed of the left uterine artery with a microcatheter and micro guide wire. A selective left uterine angiogram was performed. This demonstrated patency of the left uterine artery. Mild diffuse hypervascularity of the enlarged fibroid uterus. Access was adequate for embolization. For embolization, 1 vial of 500 - 73micron Embospheres were injected into the left uterine artery. Post embolization angiogram confirms complete stasis of the left uterine vascular territory. Microcatheter was removed.  A second C2 catheter was utilized to select the right internal iliac artery. Selective right internal iliac angiogram was performed. The patent right uterine artery was identified. For selective catheterization, the micro catheter and guidewire were utilized to select the right uterine artery. Selective right uterine angiogram was performed. This demonstrated patency of the right uterine artery. Catheter position was safe for embolization. Embolization was performed to complete stasis with injection of 1 and 1/3 vials of 500-700 micron Embospheres. Post embolization angiogram confirms complete stasis of the right uterine vascular territory.  Next, a pigtail catheter was advanced over guidewire to the level of the renal arteries. Flush aortogram performed to evaluate for ovarian supply to the uterus. Ovarian arteries are demonstrated bilaterally off of the infrarenal aorta and with delayed imaging eventually demonstrate collateral flow to the uterine vasculature. Ovarian arteries are not enlarged. Because the ovarian arteries were not  significantly enlarged and the fibroid volume is small, embolization of the ovarian arteries was not performed today. Based on clinical response, ovarian embolization may be necessary in the future.  Injection of bothcommon femoral artery sheaths confirms access is adequate for Starclose. Hemostasis was obtained with a 6-French Starclose device. No immediate complication. Peripheral pedal pulses are normal +2.  The patient tolerated the procedure well. No immediate complication.  IMPRESSION: Successful bilateral uterine artery embolization ([facility])   Electronically Signed   By: Jerilynn Mages.  Shick M.D.   On: 04/09/2014 12:41   Ir Angiogram Selective Each Additional Vessel  04/09/2014   CLINICAL DATA:  Uterine fibroids, abnormal menstrual bleeding, pelvic pain and cramping  EXAM: UTERINE FIBROID EMBOLIZATION  Date:  3/30/20163/30/2016 12:12 pm  Radiologist:  M. Daryll Brod, MD  Guidance:  Ultrasound and fluoroscopic  FLUOROSCOPY TIME:  44 minutes 30 seconds, 5179 mGy  MEDICATIONS AND MEDICAL HISTORY: 6 mg Versed, 200 mcg fentanyl, 30 mg Toradol, 4 mg Zofran, 2 g Ancef administered within 1 hour of the procedure  ANESTHESIA/SEDATION: 1 hour 50 minutes  CONTRAST:  100 cc Omnipaque 951  COMPLICATIONS: None immediate  PROCEDURE: Informed consent was obtained from the patient following explanation of the procedure, risks, benefits and alternatives. The patient understands, agrees and consents for the procedure. All questions were addressed. A time out was performed.  Maximal barrier sterile technique utilized including caps, mask, sterile gowns, sterile gloves, large sterile drape, hand hygiene, and betadine prep.  Under sterile conditions and local anesthesia, bilateralcommon femoral artery access was performed with a micropuncture needle. Ultrasound  was utilized for access. Images were obtained for documentation. Guidewire advanced followed by bilateral 5-French sheaths. A 5-French C2 catheter was utilized to select the  contralateral left internal iliac artery. Selective left internal iliac angiogram was performed. The tortuous left uterine artery was identified. Selective catheterization was performed of the left uterine artery with a microcatheter and micro guide wire. A selective left uterine angiogram was performed. This demonstrated patency of the left uterine artery. Mild diffuse hypervascularity of the enlarged fibroid uterus. Access was adequate for embolization. For embolization, 1 vial of 500 - 791micron Embospheres were injected into the left uterine artery. Post embolization angiogram confirms complete stasis of the left uterine vascular territory. Microcatheter was removed.  A second C2 catheter was utilized to select the right internal iliac artery. Selective right internal iliac angiogram was performed. The patent right uterine artery was identified. For selective catheterization, the micro catheter and guidewire were utilized to select the right uterine artery. Selective right uterine angiogram was performed. This demonstrated patency of the right uterine artery. Catheter position was safe for embolization. Embolization was performed to complete stasis with injection of 1 and 1/3 vials of 500-700 micron Embospheres. Post embolization angiogram confirms complete stasis of the right uterine vascular territory.  Next, a pigtail catheter was advanced over guidewire to the level of the renal arteries. Flush aortogram performed to evaluate for ovarian supply to the uterus. Ovarian arteries are demonstrated bilaterally off of the infrarenal aorta and with delayed imaging eventually demonstrate collateral flow to the uterine vasculature. Ovarian arteries are not enlarged. Because the ovarian arteries were not significantly enlarged and the fibroid volume is small, embolization of the ovarian arteries was not performed today. Based on clinical response, ovarian embolization may be necessary in the future.  Injection of  bothcommon femoral artery sheaths confirms access is adequate for Starclose. Hemostasis was obtained with a 6-French Starclose device. No immediate complication. Peripheral pedal pulses are normal +2.  The patient tolerated the procedure well. No immediate complication.  IMPRESSION: Successful bilateral uterine artery embolization ([facility])   Electronically Signed   By: Jerilynn Mages.  Shick M.D.   On: 04/09/2014 12:41   Ir US Guide Vasc Access Right  04/09/2014   CLINICAL DATA:  Uterine fibroids, abnormal menstrual bleeding, pelvic pain and cramping  EXAM: UTERINE FIBROID EMBOLIZATION  Date:  3/30/20163/30/2016 12:12 pm  Radiologist:  M. Daryll Brod, MD  Guidance:  Ultrasound and fluoroscopic  FLUOROSCOPY TIME:  44 minutes 30 seconds, 5179 mGy  MEDICATIONS AND MEDICAL HISTORY: 6 mg Versed, 200 mcg fentanyl, 30 mg Toradol, 4 mg Zofran, 2 g Ancef administered within 1 hour of the procedure  ANESTHESIA/SEDATION: 1 hour 50 minutes  CONTRAST:  100 cc Omnipaque 440  COMPLICATIONS: None immediate  PROCEDURE: Informed consent was obtained from the patient following explanation of the procedure, risks, benefits and alternatives. The patient understands, agrees and consents for the procedure. All questions were addressed. A time out was performed.  Maximal barrier sterile technique utilized including caps, mask, sterile gowns, sterile gloves, large sterile drape, hand hygiene, and betadine prep.  Under sterile conditions and local anesthesia, bilateralcommon femoral artery access was performed with a micropuncture needle. Ultrasound was utilized for access. Images were obtained for documentation. Guidewire advanced followed by bilateral 5-French sheaths. A 5-French C2 catheter was utilized to select the contralateral left internal iliac artery. Selective left internal iliac angiogram was performed. The tortuous left uterine artery was identified. Selective catheterization was performed of the left uterine artery with  a microcatheter  and micro guide wire. A selective left uterine angiogram was performed. This demonstrated patency of the left uterine artery. Mild diffuse hypervascularity of the enlarged fibroid uterus. Access was adequate for embolization. For embolization, 1 vial of 500 - 732micron Embospheres were injected into the left uterine artery. Post embolization angiogram confirms complete stasis of the left uterine vascular territory. Microcatheter was removed.  A second C2 catheter was utilized to select the right internal iliac artery. Selective right internal iliac angiogram was performed. The patent right uterine artery was identified. For selective catheterization, the micro catheter and guidewire were utilized to select the right uterine artery. Selective right uterine angiogram was performed. This demonstrated patency of the right uterine artery. Catheter position was safe for embolization. Embolization was performed to complete stasis with injection of 1 and 1/3 vials of 500-700 micron Embospheres. Post embolization angiogram confirms complete stasis of the right uterine vascular territory.  Next, a pigtail catheter was advanced over guidewire to the level of the renal arteries. Flush aortogram performed to evaluate for ovarian supply to the uterus. Ovarian arteries are demonstrated bilaterally off of the infrarenal aorta and with delayed imaging eventually demonstrate collateral flow to the uterine vasculature. Ovarian arteries are not enlarged. Because the ovarian arteries were not significantly enlarged and the fibroid volume is small, embolization of the ovarian arteries was not performed today. Based on clinical response, ovarian embolization may be necessary in the future.  Injection of bothcommon femoral artery sheaths confirms access is adequate for Starclose. Hemostasis was obtained with a 6-French Starclose device. No immediate complication. Peripheral pedal pulses are normal +2.  The patient tolerated the procedure  well. No immediate complication.  IMPRESSION: Successful bilateral uterine artery embolization ([facility] E)   Electronically Signed   By: Jerilynn Mages.  Shick M.D.   On: 04/09/2014 12:41   Ir Embo Tumor Organ Ischemia Infarct Inc Guide Roadmapping  04/09/2014   CLINICAL DATA:  Uterine fibroids, abnormal menstrual bleeding, pelvic pain and cramping  EXAM: UTERINE FIBROID EMBOLIZATION  Date:  3/30/20163/30/2016 12:12 pm  Radiologist:  M. Daryll Brod, MD  Guidance:  Ultrasound and fluoroscopic  FLUOROSCOPY TIME:  44 minutes 30 seconds, 5179 mGy  MEDICATIONS AND MEDICAL HISTORY: 6 mg Versed, 200 mcg fentanyl, 30 mg Toradol, 4 mg Zofran, 2 g Ancef administered within 1 hour of the procedure  ANESTHESIA/SEDATION: 1 hour 50 minutes  CONTRAST:  100 cc Omnipaque 027  COMPLICATIONS: None immediate  PROCEDURE: Informed consent was obtained from the patient following explanation of the procedure, risks, benefits and alternatives. The patient understands, agrees and consents for the procedure. All questions were addressed. A time out was performed.  Maximal barrier sterile technique utilized including caps, mask, sterile gowns, sterile gloves, large sterile drape, hand hygiene, and betadine prep.  Under sterile conditions and local anesthesia, bilateralcommon femoral artery access was performed with a micropuncture needle. Ultrasound was utilized for access. Images were obtained for documentation. Guidewire advanced followed by bilateral 5-French sheaths. A 5-French C2 catheter was utilized to select the contralateral left internal iliac artery. Selective left internal iliac angiogram was performed. The tortuous left uterine artery was identified. Selective catheterization was performed of the left uterine artery with a microcatheter and micro guide wire. A selective left uterine angiogram was performed. This demonstrated patency of the left uterine artery. Mild diffuse hypervascularity of the enlarged fibroid uterus. Access was adequate  for embolization. For embolization, 1 vial of 500 - 767micron Embospheres were injected into the left uterine artery.  Post embolization angiogram confirms complete stasis of the left uterine vascular territory. Microcatheter was removed.  A second C2 catheter was utilized to select the right internal iliac artery. Selective right internal iliac angiogram was performed. The patent right uterine artery was identified. For selective catheterization, the micro catheter and guidewire were utilized to select the right uterine artery. Selective right uterine angiogram was performed. This demonstrated patency of the right uterine artery. Catheter position was safe for embolization. Embolization was performed to complete stasis with injection of 1 and 1/3 vials of 500-700 micron Embospheres. Post embolization angiogram confirms complete stasis of the right uterine vascular territory.  Next, a pigtail catheter was advanced over guidewire to the level of the renal arteries. Flush aortogram performed to evaluate for ovarian supply to the uterus. Ovarian arteries are demonstrated bilaterally off of the infrarenal aorta and with delayed imaging eventually demonstrate collateral flow to the uterine vasculature. Ovarian arteries are not enlarged. Because the ovarian arteries were not significantly enlarged and the fibroid volume is small, embolization of the ovarian arteries was not performed today. Based on clinical response, ovarian embolization may be necessary in the future.  Injection of bothcommon femoral artery sheaths confirms access is adequate for Starclose. Hemostasis was obtained with a 6-French Starclose device. No immediate complication. Peripheral pedal pulses are normal +2.  The patient tolerated the procedure well. No immediate complication.  IMPRESSION: Successful bilateral uterine artery embolization (U F E)   Electronically Signed   By: Jerilynn Mages.  Shick M.D.   On: 04/09/2014 12:41     Discharge Exam: Blood  pressure 138/81, pulse 76, temperature 98.4 F (36.9 C), temperature source Oral, resp. rate 11, height 5\' 7"  (1.702 m), weight 166 lb (75.297 kg), last menstrual period 03/17/2014, SpO2 96 %. Patient is awake, alert and oriented. Chest is clear to auscultation bilaterally, heart with regular rate and rhythm, abdomen soft, positive bowel sounds, mildly tender anterior pelvic region, puncture sites bilateral common femoral arteries clean, dry, mildly tender, no hematomas. Extremities with full range of motion, no edema, intact distal pulses.  Disposition: home  Discharge Instructions    Call MD for:  difficulty breathing, headache or visual disturbances    Complete by:  As directed      Call MD for:  extreme fatigue    Complete by:  As directed      Call MD for:  hives    Complete by:  As directed      Call MD for:  persistant dizziness or light-headedness    Complete by:  As directed      Call MD for:  persistant nausea and vomiting    Complete by:  As directed      Call MD for:  redness, tenderness, or signs of infection (pain, swelling, redness, odor or green/yellow discharge around incision site)    Complete by:  As directed      Call MD for:  severe uncontrolled pain    Complete by:  As directed      Call MD for:  temperature >100.4    Complete by:  As directed      Change dressing (specify)    Complete by:  As directed   May remove bandages from both groin sites and apply Band-Aid to sites daily for the next 2-3 days     Diet - low sodium heart healthy    Complete by:  As directed      Driving Restrictions    Complete by:  As  directed   No driving for the next 24 hours     Increase activity slowly    Complete by:  As directed      Lifting restrictions    Complete by:  As directed   No heavy lifting for the next 3-4 days     May shower / Bathe    Complete by:  As directed      May walk up steps    Complete by:  As directed      Sexual Activity Restrictions    Complete by:   As directed   No sexual intercourse for 1 week            Medication List    STOP taking these medications        nitrofurantoin (macrocrystal-monohydrate) 100 MG capsule  Commonly known as:  MACROBID      TAKE these medications        IRON PO  Take 1 tablet by mouth every morning.     multivitamin tablet  Take 1 tablet by mouth every morning.     psyllium 0.52 G capsule  Commonly known as:  REGULOID  Take 1.04 g by mouth every morning.     Vitamin D2 2000 UNITS Tabs  Take 1 tablet by mouth daily.           Follow-up Information    Follow up with Greggory Keen, MD.   Specialty:  Interventional Radiology   Why:  Radiology will call you with follow up appointment with Dr. Annamaria Boots in approximately 3 weeks. Call [phone] or [phone] with any questions or concerns   Contact information:   [address] [place] [address] [phone]       Follow up with Marvene Staff, MD.   Specialty:  Obstetrics and Gynecology   Why:  Continue current care with Dr. Garwin Brothers as scheduled   Contact information:   [address] [place] [id number] [phone]       I have spent (less than 30 minutes coordinating discharge for Karuna Balducci.    SignedRowe Robert, PAC        04/10/2014, 2:21 PM

## 2014-04-11 ENCOUNTER — Other Ambulatory Visit (HOSPITAL_COMMUNITY): Payer: Self-pay | Admitting: Interventional Radiology

## 2014-04-11 ENCOUNTER — Encounter (HOSPITAL_COMMUNITY): Payer: Self-pay | Admitting: *Deleted

## 2014-04-11 ENCOUNTER — Emergency Department (HOSPITAL_COMMUNITY): Admission: EM | Admit: 2014-04-11 | Payer: Federal, State, Local not specified - PPO | Source: Home / Self Care

## 2014-04-11 ENCOUNTER — Inpatient Hospital Stay (HOSPITAL_COMMUNITY)
Admission: AD | Admit: 2014-04-11 | Discharge: 2014-04-11 | Disposition: A | Discharge status: Home / Self Care | Payer: Federal, State, Local not specified - PPO | Source: Ambulatory Visit | Attending: Obstetrics and Gynecology | Admitting: Obstetrics and Gynecology

## 2014-04-11 DIAGNOSIS — F17210 Nicotine dependence, cigarettes, uncomplicated: Secondary | ICD-10-CM | POA: Insufficient documentation

## 2014-04-11 DIAGNOSIS — D259 Leiomyoma of uterus, unspecified: Secondary | ICD-10-CM | POA: Insufficient documentation

## 2014-04-11 DIAGNOSIS — Z9889 Other specified postprocedural states: Secondary | ICD-10-CM | POA: Insufficient documentation

## 2014-04-11 DIAGNOSIS — G8918 Other acute postprocedural pain: Secondary | ICD-10-CM | POA: Diagnosis present

## 2014-04-11 DIAGNOSIS — R102 Pelvic and perineal pain: Secondary | ICD-10-CM | POA: Insufficient documentation

## 2014-04-11 DIAGNOSIS — D251 Intramural leiomyoma of uterus: Secondary | ICD-10-CM

## 2014-04-11 HISTORY — DX: Other acute postprocedural pain: G89.18

## 2014-04-11 MED ORDER — HYDROCODONE-ACETAMINOPHEN 5-325 MG PO TABS
1.0000 | ORAL_TABLET | Freq: Once | ORAL | Status: AC
  Administered 2014-04-11: 1 via ORAL
  Filled 2014-04-11: qty 1

## 2014-04-11 MED ORDER — PROMETHAZINE HCL 25 MG PO TABS
12.5000 mg | ORAL_TABLET | Freq: Once | ORAL | Status: AC
  Administered 2014-04-11: 12.5 mg via ORAL
  Filled 2014-04-11: qty 1

## 2014-04-11 NOTE — Progress Notes (Signed)
Olivia Dunn, CNM discussed patient concerns and advised patient to get Rx's filled and take as directed. Also advised patient to F/U with MD that performed her surgery. Patient was given pain medication. Informed patient that provider is printing D/C instrs and will bring in as soon as printed. Patient left immediately without D/C papers and did not sign e-sig.

## 2014-04-11 NOTE — Discharge Instructions (Signed)
HAVE YOUR PRESCRIPTIONS FILLED AND TAKE THEM AS PRESCRIBED.  Please call Dr. Fritz Pickerel office ([facility] office at [phone] or [phone]), if you have any further questions, problems or concerns. Pain Relief Preoperatively and Postoperatively Being a good patient does not mean being a silent one.If you have questions, problems, or concerns about the pain you may feel after surgery, let your caregiver know.Patients have the right to assessment and management of pain. The treatment of pain after surgery is important to speed up recovery and return to normal activities. Severe pain after surgery, and the fear or anxiety associated with that pain, may cause extreme discomfort that:  Prevents sleep.  Decreases the ability to breathe deeply and cough. This can cause pneumonia or other upper airway infections.  Causes your heart to beat faster and your blood pressure to be higher.  Increases the risk for constipation and bloating.  Decreases the ability of wounds to heal.  May result in depression, increased anxiety, and feelings of helplessness. Relief of pain before surgery is also important because it will lessen the pain after surgery. Patients who receive both pain relief before and after surgery experience greater pain relief than those who only receive pain relief after surgery. Let your caregiver know if you are having uncontrolled pain.This is very important.Pain after surgery is more difficult to manage if it is permitted to become severe, so prompt and adequate treatment of acute pain is necessary. PAIN CONTROL METHODS Your caregivers follow policies and procedures about the management of patient pain.These guidelines should be explained to you before surgery.Plans for pain control after surgery must be mutually decided upon and instituted with your full understanding and agreement.Do not be afraid to ask questions regarding the care you are receiving.There are  many different ways your caregivers will attempt to control your pain, including the following methods. As needed pain control  You may be given pain medicine either through your intravenous (IV) tube, or as a pill or liquid you can swallow. You will need to let your caregiver know when you are having pain. Then, your caregiver will give you the pain medicine ordered for you.  Your pain medicine may make you constipated. If constipation occurs, drink more liquids if you can. Your caregiver may have you take a mild laxative. IV patient-controlled analgesia pump (PCA pump)  You can get your pain medicine through the IV tube which goes into your vein. You are able to control the amount of pain medicine that you get. The pain medicine flows in through an IV tube and is controlled by a pump. This pump gives you a set amount of pain medicine when you push the button hooked up to it. Nobody should push this button but you or someone specifically assigned by you to do so. It is set up to keep you from accidentally giving yourself too much pain medicine. You will be able to start using your pain pump in the recovery room after your surgery. This method can be helpful for most types of surgery.  If you are still having too much pain, tell your caregiver. Also, tell your caregiver if you are feeling too sleepy or nauseous. Continuous epidural pain control  A thin, soft tube (catheter) is put into your back. Pain medicine flows through the catheter to lessen pain in the part of your body where the surgery is done. Continuous epidural pain control may work best for you if you are having surgery on your chest,  abdomen, hip area, or legs. The epidural catheter is usually put into your back just before surgery. The catheter is left in until you can eat and take medicine by mouth. In most cases, this may take 2 to 3 days.  Giving pain medicine through the epidural catheter may help you heal faster because:  Your  bowel gets back to normal faster.  You can get back to eating sooner.  You can be up and walking sooner. Medicine that numbs the area (local anesthetic)  You may receive an injection of pain medicine near where the pain is (local infiltration).  You may receive an injection of pain medicine near the nerve that controls the sensation to a specific part of the body (peripheral nerve block).  Medicine may be put in the spine to block pain (spinal block). Opioids  Moderate to moderately severe acute pain after surgery may respond to opioids.Opioids are narcotic pain medicine. Opioids are often combined with non-narcotic medicines to improve pain relief, diminish the risk of side effects, and reduce the chance of addiction.  If you follow your caregiver's directions about taking opioids and you do not have a history of substance abuse, your risk of becoming addicted is exceptionally small.Opioids are given for short periods of time in careful doses to prevent addiction. Other methods of pain control include:  Steroids.  Physical therapy.  Heat and cold therapy.  Compression, such as wrapping an elastic bandage around the area of pain.  Massage. These various ways of controlling pain may be used together. Combining different methods of pain control is called multimodal analgesia. Using this approach has many benefits, including being able to eat, move around, and leave the hospital sooner. Document Released: 03/19/2002 Document Revised: 03/21/2011 Document Reviewed: 03/23/2010 [facility] Patient Information 2015 [place], [place]. This information is not intended to replace advice given to you by your health care provider. Make sure you discuss any questions you have with your health care provider.

## 2014-04-11 NOTE — ED Notes (Signed)
Pt left the lobby before getting called to triage

## 2014-04-11 NOTE — MAU Provider Note (Signed)
History     CSN: [id number]  Arrival date and time: 04/11/14 0543  Provider notified: 0609 Provider on unit: 0700 Provider at bedside: 0705     Chief Complaint  Patient presents with  . Pelvic Pain   HPI  Ms. Olivia Dunn is a 44 yo G17P2002 female who presents this morning with complaints of pain after surgery.  She had [place] surgery on 3/30 by Dr. Annamaria Boots.  She was d/c'd from hospital yesterday (3/31).  She was given Rx's for Vicodin, Motrin, Phenergan, and Colace.  She has not had her prescriptions filled, because she "lost bank card" and she "couldn't go inside the bank, because of being in so much pain".  She also complains that she told Dr. Fritz Pickerel PA that she had not had a BM since Tuesday, but he d/c'd her home anyway.  She states she really thinks this "pain is from gas and constipation."  She reports that she took a shower when she got home and took off the gauze pads that were covering her incisions in her groin.  According to [facility], patient went to [facility] this morning prior to coming to [facility].  She left [facility] before to being triaged.  Past Medical History  Diagnosis Date  . Acid reflux   . Fibroids   . Post-operative pain 04/11/2014    Past Surgical History  Procedure Laterality Date  . Tubal ligation      History reviewed. No pertinent family history.  History  Substance Use Topics  . Smoking status: Current Every Day Smoker -- 0.25 packs/day    Types: Cigarettes  . Smokeless tobacco: Not on file  . Alcohol Use: Yes     Comment: occassional    Allergies:  Allergies  Allergen Reactions  . Oxycodone Itching and Nausea Only    Prescriptions prior to admission  Medication Sig Dispense Refill Last Dose  . Ergocalciferol (VITAMIN D2) 2000 UNITS TABS Take 1 tablet by mouth daily.   04/09/2014 at Unknown time  . IRON PO Take 1 tablet by mouth every morning.   04/09/2014 at Unknown time  . Multiple Vitamin (MULTIVITAMIN) tablet Take 1 tablet by mouth every morning.     04/09/2014 at Unknown time  . psyllium (REGULOID) 0.52 G capsule Take 1.04 g by mouth every morning.   04/08/2014 at Unknown time    Review of Systems  Constitutional: Negative.   HENT: Negative.   Eyes: Negative.   Respiratory: Negative.   Cardiovascular: Negative.   Gastrointestinal: Positive for abdominal pain and constipation.       Has not eaten since before d/c yesterday  Genitourinary: Negative.   Musculoskeletal: Negative.   Skin: Negative.   Neurological: Negative.   Endo/Heme/Allergies: Negative.   Psychiatric/Behavioral: Negative.    Physical Exam   Blood pressure 163/89, pulse 90, temperature 99 F (37.2 C), resp. rate 20, height 5' 6.93" (1.7 m), weight 75.66 kg (166 lb 12.8 oz), last menstrual period 03/17/2014.  Physical Exam  Constitutional: She is oriented to person, place, and time. She appears well-developed and well-nourished.  HENT:  Head: Normocephalic and atraumatic.  Eyes: Pupils are equal, round, and reactive to light.  Neck: Normal range of motion.  Cardiovascular: Normal rate, regular rhythm and normal heart sounds.   Respiratory: Effort normal and breath sounds normal.  GI: Soft. Bowel sounds are normal.  Genitourinary:  Pelvic exam deferred; scant amount of blood on peripad; 1 cm incision in both the RT & LT groin areas are C/D/I - no  erythema, ecchymosis or drainage noted  Musculoskeletal: Normal range of motion.  Neurological: She is alert and oriented to person, place, and time.  Skin: Skin is warm and dry.  Psychiatric: She has a normal mood and affect. Her behavior is normal. Judgment and thought content normal.    MAU Course  Procedures Vicodin 5/325 mg po Phenergan 12.5 mg po Assessment and Plan  44 yo Non-pregnant female Uterine Fibroids S/P Uterine Artery Embolization (3/30)  Get Rx's filled and take as prescribed  Discharge instructions provided for postoperative pain - patient left angry without receiving d/c paperwork, but not  before taking medications Call [facility] office at [phone] or [phone] with any questions or concerns F/U with Dr. Reesa Chew in 3 weeks Call [facility] office BEFORE coming to hospital for any problems, questions or concerns  Graceann Congress MSN, CNM 04/11/2014, 7:41 AM

## 2014-04-11 NOTE — MAU Note (Signed)
Pt had surgery 3/30. Discharged on 3/31. Pain since discharge, only taking motrin for pain. Vaginal bleeding, denies discharge.

## 2014-04-14 ENCOUNTER — Other Ambulatory Visit (HOSPITAL_COMMUNITY): Payer: Self-pay | Admitting: Interventional Radiology

## 2014-04-14 DIAGNOSIS — D251 Intramural leiomyoma of uterus: Secondary | ICD-10-CM

## 2014-04-15 ENCOUNTER — Other Ambulatory Visit: Payer: Federal, State, Local not specified - PPO

## 2014-04-16 ENCOUNTER — Ambulatory Visit
Admission: RE | Admit: 2014-04-16 | Discharge: 2014-04-16 | Disposition: A | Discharge status: Home / Self Care | Payer: Federal, State, Local not specified - PPO | Source: Ambulatory Visit | Attending: Interventional Radiology | Admitting: Interventional Radiology

## 2014-04-16 ENCOUNTER — Encounter: Payer: Self-pay | Admitting: Radiology

## 2014-04-16 DIAGNOSIS — D251 Intramural leiomyoma of uterus: Secondary | ICD-10-CM

## 2014-04-16 NOTE — Progress Notes (Signed)
Patient ID: Olivia Dunn, female   DOB: 08-19-1970, 44 y.o.   MRN: [id number]    Chief Complaint: Chief Complaint  Patient presents with  . Follow-up    1 wk follow up [place]      Referring Physician(s): Dannia Snook  History of Present Illness: Olivia Dunn is a 44 y.o. female one-week status post uterine fibroid embolization. Patient had a overnight observation recovery at [facility] and was discharged the following day. She has been at home for one week. Pelvic pain and cramping is slowly improving. Constipation is proving what she stop the Vicodin. She continues to take Motrin daily. She has minor residual pelvic pain and cramping and right anterior thigh pain. She is ambulating without difficulty. She is walking outside daily. No interval fevers. Minor vaginal spotting. Overall she seems to be recovering very well.  Past Medical History  Diagnosis Date  . Acid reflux   . Fibroids   . Post-operative pain 04/11/2014    Past Surgical History  Procedure Laterality Date  . Tubal ligation      Allergies: Oxycodone  Medications: Prior to Admission medications   Medication Sig Start Date End Date Taking? Authorizing Provider  Ergocalciferol (VITAMIN D2) 2000 UNITS TABS Take 1 tablet by mouth daily.   Yes Historical Provider, MD  Multiple Vitamin (MULTIVITAMIN) tablet Take 1 tablet by mouth every morning.    Yes Historical Provider, MD  psyllium (REGULOID) 0.52 G capsule Take 1.04 g by mouth every morning.   Yes Historical Provider, MD  IRON PO Take 1 tablet by mouth every morning.    Historical Provider, MD     No family history on file.  History   Social History  . Marital Status: Single    Spouse Name: N/A  . Number of Children: N/A  . Years of Education: N/A   Social History Main Topics  . Smoking status: Current Every Day Smoker -- 0.25 packs/day    Types: Cigarettes  . Smokeless tobacco: Not on file  . Alcohol Use: Yes     Comment:  occassional  . Drug Use: Yes    Special: Marijuana  . Sexual Activity: Yes    Birth Control/ Protection: Surgical, Condom   Other Topics Concern  . Not on file   Social History Narrative    Review of Systems: A 12 point ROS discussed and pertinent positives are indicated in the HPI above.  All other systems are negative.  Review of Systems  Constitutional: Positive for fatigue. Negative for fever and appetite change.  Cardiovascular: Negative for chest pain and palpitations.  Gastrointestinal: Negative for abdominal distention.  Genitourinary: Positive for vaginal bleeding, vaginal discharge and pelvic pain. Negative for dysuria, urgency, frequency, flank pain and difficulty urinating.    Vital Signs: BP 119/81 mmHg  Pulse 66  Temp(Src) 98.3 F (36.8 C) (Oral)  Resp 14  SpO2 100%  LMP 03/17/2014 (Approximate)  Physical Exam  Constitutional: She appears well-developed and well-nourished. No distress.  Cardiovascular: Normal rate, regular rhythm and intact distal pulses.  Exam reveals no gallop and no friction rub.   No murmur heard. +2 symmetric femoral, posterior tibial, and dorsalis pedis pulses.  No inguinal abnormality or hematoma   Skin: She is not diaphoretic.    Imaging: Ir Angiogram Pelvis Selective Or Supraselective  04/09/2014   CLINICAL DATA:  Uterine fibroids, abnormal menstrual bleeding, pelvic pain and cramping  EXAM: UTERINE FIBROID EMBOLIZATION  Date:  3/30/20163/30/2016 12:12 pm  Radiologist:  M. Tomasita Crumble  Loriene Taunton, MD  Guidance:  Ultrasound and fluoroscopic  FLUOROSCOPY TIME:  44 minutes 30 seconds, 5179 mGy  MEDICATIONS AND MEDICAL HISTORY: 6 mg Versed, 200 mcg fentanyl, 30 mg Toradol, 4 mg Zofran, 2 g Ancef administered within 1 hour of the procedure  ANESTHESIA/SEDATION: 1 hour 50 minutes  CONTRAST:  100 cc Omnipaque 027  COMPLICATIONS: None immediate  PROCEDURE: Informed consent was obtained from the patient following explanation of the procedure, risks,  benefits and alternatives. The patient understands, agrees and consents for the procedure. All questions were addressed. A time out was performed.  Maximal barrier sterile technique utilized including caps, mask, sterile gowns, sterile gloves, large sterile drape, hand hygiene, and betadine prep.  Under sterile conditions and local anesthesia, bilateralcommon femoral artery access was performed with a micropuncture needle. Ultrasound was utilized for access. Images were obtained for documentation. Guidewire advanced followed by bilateral 5-French sheaths. A 5-French C2 catheter was utilized to select the contralateral left internal iliac artery. Selective left internal iliac angiogram was performed. The tortuous left uterine artery was identified. Selective catheterization was performed of the left uterine artery with a microcatheter and micro guide wire. A selective left uterine angiogram was performed. This demonstrated patency of the left uterine artery. Mild diffuse hypervascularity of the enlarged fibroid uterus. Access was adequate for embolization. For embolization, 1 vial of 500 - 73micron Embospheres were injected into the left uterine artery. Post embolization angiogram confirms complete stasis of the left uterine vascular territory. Microcatheter was removed.  A second C2 catheter was utilized to select the right internal iliac artery. Selective right internal iliac angiogram was performed. The patent right uterine artery was identified. For selective catheterization, the micro catheter and guidewire were utilized to select the right uterine artery. Selective right uterine angiogram was performed. This demonstrated patency of the right uterine artery. Catheter position was safe for embolization. Embolization was performed to complete stasis with injection of 1 and 1/3 vials of 500-700 micron Embospheres. Post embolization angiogram confirms complete stasis of the right uterine vascular territory.  Next,  a pigtail catheter was advanced over guidewire to the level of the renal arteries. Flush aortogram performed to evaluate for ovarian supply to the uterus. Ovarian arteries are demonstrated bilaterally off of the infrarenal aorta and with delayed imaging eventually demonstrate collateral flow to the uterine vasculature. Ovarian arteries are not enlarged. Because the ovarian arteries were not significantly enlarged and the fibroid volume is small, embolization of the ovarian arteries was not performed today. Based on clinical response, ovarian embolization may be necessary in the future.  Injection of bothcommon femoral artery sheaths confirms access is adequate for Starclose. Hemostasis was obtained with a 6-French Starclose device. No immediate complication. Peripheral pedal pulses are normal +2.  The patient tolerated the procedure well. No immediate complication.  IMPRESSION: Successful bilateral uterine artery embolization (U F E)   Electronically Signed   By: Jerilynn Mages.  Quade Ramirez M.D.   On: 04/09/2014 12:41   Ir Angiogram Pelvis Selective Or Supraselective  04/09/2014   CLINICAL DATA:  Uterine fibroids, abnormal menstrual bleeding, pelvic pain and cramping  EXAM: UTERINE FIBROID EMBOLIZATION  Date:  3/30/20163/30/2016 12:12 pm  Radiologist:  M. Daryll Brod, MD  Guidance:  Ultrasound and fluoroscopic  FLUOROSCOPY TIME:  44 minutes 30 seconds, 5179 mGy  MEDICATIONS AND MEDICAL HISTORY: 6 mg Versed, 200 mcg fentanyl, 30 mg Toradol, 4 mg Zofran, 2 g Ancef administered within 1 hour of the procedure  ANESTHESIA/SEDATION: 1 hour 50 minutes  CONTRAST:  100 cc Omnipaque 761  COMPLICATIONS: None immediate  PROCEDURE: Informed consent was obtained from the patient following explanation of the procedure, risks, benefits and alternatives. The patient understands, agrees and consents for the procedure. All questions were addressed. A time out was performed.  Maximal barrier sterile technique utilized including caps, mask, sterile  gowns, sterile gloves, large sterile drape, hand hygiene, and betadine prep.  Under sterile conditions and local anesthesia, bilateralcommon femoral artery access was performed with a micropuncture needle. Ultrasound was utilized for access. Images were obtained for documentation. Guidewire advanced followed by bilateral 5-French sheaths. A 5-French C2 catheter was utilized to select the contralateral left internal iliac artery. Selective left internal iliac angiogram was performed. The tortuous left uterine artery was identified. Selective catheterization was performed of the left uterine artery with a microcatheter and micro guide wire. A selective left uterine angiogram was performed. This demonstrated patency of the left uterine artery. Mild diffuse hypervascularity of the enlarged fibroid uterus. Access was adequate for embolization. For embolization, 1 vial of 500 - 743micron Embospheres were injected into the left uterine artery. Post embolization angiogram confirms complete stasis of the left uterine vascular territory. Microcatheter was removed.  A second C2 catheter was utilized to select the right internal iliac artery. Selective right internal iliac angiogram was performed. The patent right uterine artery was identified. For selective catheterization, the micro catheter and guidewire were utilized to select the right uterine artery. Selective right uterine angiogram was performed. This demonstrated patency of the right uterine artery. Catheter position was safe for embolization. Embolization was performed to complete stasis with injection of 1 and 1/3 vials of 500-700 micron Embospheres. Post embolization angiogram confirms complete stasis of the right uterine vascular territory.  Next, a pigtail catheter was advanced over guidewire to the level of the renal arteries. Flush aortogram performed to evaluate for ovarian supply to the uterus. Ovarian arteries are demonstrated bilaterally off of the  infrarenal aorta and with delayed imaging eventually demonstrate collateral flow to the uterine vasculature. Ovarian arteries are not enlarged. Because the ovarian arteries were not significantly enlarged and the fibroid volume is small, embolization of the ovarian arteries was not performed today. Based on clinical response, ovarian embolization may be necessary in the future.  Injection of bothcommon femoral artery sheaths confirms access is adequate for Starclose. Hemostasis was obtained with a 6-French Starclose device. No immediate complication. Peripheral pedal pulses are normal +2.  The patient tolerated the procedure well. No immediate complication.  IMPRESSION: Successful bilateral uterine artery embolization (U F E)   Electronically Signed   By: Jerilynn Mages.  Humberto Addo M.D.   On: 04/09/2014 12:41   Ir Angiogram Selective Each Additional Vessel  04/09/2014   CLINICAL DATA:  Uterine fibroids, abnormal menstrual bleeding, pelvic pain and cramping  EXAM: UTERINE FIBROID EMBOLIZATION  Date:  3/30/20163/30/2016 12:12 pm  Radiologist:  M. Daryll Brod, MD  Guidance:  Ultrasound and fluoroscopic  FLUOROSCOPY TIME:  44 minutes 30 seconds, 5179 mGy  MEDICATIONS AND MEDICAL HISTORY: 6 mg Versed, 200 mcg fentanyl, 30 mg Toradol, 4 mg Zofran, 2 g Ancef administered within 1 hour of the procedure  ANESTHESIA/SEDATION: 1 hour 50 minutes  CONTRAST:  100 cc Omnipaque 607  COMPLICATIONS: None immediate  PROCEDURE: Informed consent was obtained from the patient following explanation of the procedure, risks, benefits and alternatives. The patient understands, agrees and consents for the procedure. All questions were addressed. A time out was performed.  Maximal barrier sterile technique utilized including caps, mask, sterile  gowns, sterile gloves, large sterile drape, hand hygiene, and betadine prep.  Under sterile conditions and local anesthesia, bilateralcommon femoral artery access was performed with a micropuncture needle.  Ultrasound was utilized for access. Images were obtained for documentation. Guidewire advanced followed by bilateral 5-French sheaths. A 5-French C2 catheter was utilized to select the contralateral left internal iliac artery. Selective left internal iliac angiogram was performed. The tortuous left uterine artery was identified. Selective catheterization was performed of the left uterine artery with a microcatheter and micro guide wire. A selective left uterine angiogram was performed. This demonstrated patency of the left uterine artery. Mild diffuse hypervascularity of the enlarged fibroid uterus. Access was adequate for embolization. For embolization, 1 vial of 500 - 757micron Embospheres were injected into the left uterine artery. Post embolization angiogram confirms complete stasis of the left uterine vascular territory. Microcatheter was removed.  A second C2 catheter was utilized to select the right internal iliac artery. Selective right internal iliac angiogram was performed. The patent right uterine artery was identified. For selective catheterization, the micro catheter and guidewire were utilized to select the right uterine artery. Selective right uterine angiogram was performed. This demonstrated patency of the right uterine artery. Catheter position was safe for embolization. Embolization was performed to complete stasis with injection of 1 and 1/3 vials of 500-700 micron Embospheres. Post embolization angiogram confirms complete stasis of the right uterine vascular territory.  Next, a pigtail catheter was advanced over guidewire to the level of the renal arteries. Flush aortogram performed to evaluate for ovarian supply to the uterus. Ovarian arteries are demonstrated bilaterally off of the infrarenal aorta and with delayed imaging eventually demonstrate collateral flow to the uterine vasculature. Ovarian arteries are not enlarged. Because the ovarian arteries were not significantly enlarged and the  fibroid volume is small, embolization of the ovarian arteries was not performed today. Based on clinical response, ovarian embolization may be necessary in the future.  Injection of bothcommon femoral artery sheaths confirms access is adequate for Starclose. Hemostasis was obtained with a 6-French Starclose device. No immediate complication. Peripheral pedal pulses are normal +2.  The patient tolerated the procedure well. No immediate complication.  IMPRESSION: Successful bilateral uterine artery embolization (U F E)   Electronically Signed   By: Jerilynn Mages.  Eldean Nanna M.D.   On: 04/09/2014 12:41   Ir Angiogram Selective Each Additional Vessel  04/09/2014   CLINICAL DATA:  Uterine fibroids, abnormal menstrual bleeding, pelvic pain and cramping  EXAM: UTERINE FIBROID EMBOLIZATION  Date:  3/30/20163/30/2016 12:12 pm  Radiologist:  M. Daryll Brod, MD  Guidance:  Ultrasound and fluoroscopic  FLUOROSCOPY TIME:  44 minutes 30 seconds, 5179 mGy  MEDICATIONS AND MEDICAL HISTORY: 6 mg Versed, 200 mcg fentanyl, 30 mg Toradol, 4 mg Zofran, 2 g Ancef administered within 1 hour of the procedure  ANESTHESIA/SEDATION: 1 hour 50 minutes  CONTRAST:  100 cc Omnipaque 175  COMPLICATIONS: None immediate  PROCEDURE: Informed consent was obtained from the patient following explanation of the procedure, risks, benefits and alternatives. The patient understands, agrees and consents for the procedure. All questions were addressed. A time out was performed.  Maximal barrier sterile technique utilized including caps, mask, sterile gowns, sterile gloves, large sterile drape, hand hygiene, and betadine prep.  Under sterile conditions and local anesthesia, bilateralcommon femoral artery access was performed with a micropuncture needle. Ultrasound was utilized for access. Images were obtained for documentation. Guidewire advanced followed by bilateral 5-French sheaths. A 5-French C2 catheter was utilized to select the  contralateral left internal iliac  artery. Selective left internal iliac angiogram was performed. The tortuous left uterine artery was identified. Selective catheterization was performed of the left uterine artery with a microcatheter and micro guide wire. A selective left uterine angiogram was performed. This demonstrated patency of the left uterine artery. Mild diffuse hypervascularity of the enlarged fibroid uterus. Access was adequate for embolization. For embolization, 1 vial of 500 - 78micron Embospheres were injected into the left uterine artery. Post embolization angiogram confirms complete stasis of the left uterine vascular territory. Microcatheter was removed.  A second C2 catheter was utilized to select the right internal iliac artery. Selective right internal iliac angiogram was performed. The patent right uterine artery was identified. For selective catheterization, the micro catheter and guidewire were utilized to select the right uterine artery. Selective right uterine angiogram was performed. This demonstrated patency of the right uterine artery. Catheter position was safe for embolization. Embolization was performed to complete stasis with injection of 1 and 1/3 vials of 500-700 micron Embospheres. Post embolization angiogram confirms complete stasis of the right uterine vascular territory.  Next, a pigtail catheter was advanced over guidewire to the level of the renal arteries. Flush aortogram performed to evaluate for ovarian supply to the uterus. Ovarian arteries are demonstrated bilaterally off of the infrarenal aorta and with delayed imaging eventually demonstrate collateral flow to the uterine vasculature. Ovarian arteries are not enlarged. Because the ovarian arteries were not significantly enlarged and the fibroid volume is small, embolization of the ovarian arteries was not performed today. Based on clinical response, ovarian embolization may be necessary in the future.  Injection of bothcommon femoral artery sheaths  confirms access is adequate for Starclose. Hemostasis was obtained with a 6-French Starclose device. No immediate complication. Peripheral pedal pulses are normal +2.  The patient tolerated the procedure well. No immediate complication.  IMPRESSION: Successful bilateral uterine artery embolization (U F E)   Electronically Signed   By: Jerilynn Mages.  Reino Lybbert M.D.   On: 04/09/2014 12:41   Ir US Guide Vasc Access Right  04/09/2014   CLINICAL DATA:  Uterine fibroids, abnormal menstrual bleeding, pelvic pain and cramping  EXAM: UTERINE FIBROID EMBOLIZATION  Date:  3/30/20163/30/2016 12:12 pm  Radiologist:  M. Daryll Brod, MD  Guidance:  Ultrasound and fluoroscopic  FLUOROSCOPY TIME:  44 minutes 30 seconds, 5179 mGy  MEDICATIONS AND MEDICAL HISTORY: 6 mg Versed, 200 mcg fentanyl, 30 mg Toradol, 4 mg Zofran, 2 g Ancef administered within 1 hour of the procedure  ANESTHESIA/SEDATION: 1 hour 50 minutes  CONTRAST:  100 cc Omnipaque 161  COMPLICATIONS: None immediate  PROCEDURE: Informed consent was obtained from the patient following explanation of the procedure, risks, benefits and alternatives. The patient understands, agrees and consents for the procedure. All questions were addressed. A time out was performed.  Maximal barrier sterile technique utilized including caps, mask, sterile gowns, sterile gloves, large sterile drape, hand hygiene, and betadine prep.  Under sterile conditions and local anesthesia, bilateralcommon femoral artery access was performed with a micropuncture needle. Ultrasound was utilized for access. Images were obtained for documentation. Guidewire advanced followed by bilateral 5-French sheaths. A 5-French C2 catheter was utilized to select the contralateral left internal iliac artery. Selective left internal iliac angiogram was performed. The tortuous left uterine artery was identified. Selective catheterization was performed of the left uterine artery with a microcatheter and micro guide wire. A selective  left uterine angiogram was performed. This demonstrated patency of the left uterine artery. Mild diffuse hypervascularity  of the enlarged fibroid uterus. Access was adequate for embolization. For embolization, 1 vial of 500 - 741micron Embospheres were injected into the left uterine artery. Post embolization angiogram confirms complete stasis of the left uterine vascular territory. Microcatheter was removed.  A second C2 catheter was utilized to select the right internal iliac artery. Selective right internal iliac angiogram was performed. The patent right uterine artery was identified. For selective catheterization, the micro catheter and guidewire were utilized to select the right uterine artery. Selective right uterine angiogram was performed. This demonstrated patency of the right uterine artery. Catheter position was safe for embolization. Embolization was performed to complete stasis with injection of 1 and 1/3 vials of 500-700 micron Embospheres. Post embolization angiogram confirms complete stasis of the right uterine vascular territory.  Next, a pigtail catheter was advanced over guidewire to the level of the renal arteries. Flush aortogram performed to evaluate for ovarian supply to the uterus. Ovarian arteries are demonstrated bilaterally off of the infrarenal aorta and with delayed imaging eventually demonstrate collateral flow to the uterine vasculature. Ovarian arteries are not enlarged. Because the ovarian arteries were not significantly enlarged and the fibroid volume is small, embolization of the ovarian arteries was not performed today. Based on clinical response, ovarian embolization may be necessary in the future.  Injection of bothcommon femoral artery sheaths confirms access is adequate for Starclose. Hemostasis was obtained with a 6-French Starclose device. No immediate complication. Peripheral pedal pulses are normal +2.  The patient tolerated the procedure well. No immediate complication.   IMPRESSION: Successful bilateral uterine artery embolization (U F E)   Electronically Signed   By: Jerilynn Mages.  Latreece Mochizuki M.D.   On: 04/09/2014 12:41   Ir Embo Tumor Organ Ischemia Infarct Inc Guide Roadmapping  04/09/2014   CLINICAL DATA:  Uterine fibroids, abnormal menstrual bleeding, pelvic pain and cramping  EXAM: UTERINE FIBROID EMBOLIZATION  Date:  3/30/20163/30/2016 12:12 pm  Radiologist:  M. Daryll Brod, MD  Guidance:  Ultrasound and fluoroscopic  FLUOROSCOPY TIME:  44 minutes 30 seconds, 5179 mGy  MEDICATIONS AND MEDICAL HISTORY: 6 mg Versed, 200 mcg fentanyl, 30 mg Toradol, 4 mg Zofran, 2 g Ancef administered within 1 hour of the procedure  ANESTHESIA/SEDATION: 1 hour 50 minutes  CONTRAST:  100 cc Omnipaque 409  COMPLICATIONS: None immediate  PROCEDURE: Informed consent was obtained from the patient following explanation of the procedure, risks, benefits and alternatives. The patient understands, agrees and consents for the procedure. All questions were addressed. A time out was performed.  Maximal barrier sterile technique utilized including caps, mask, sterile gowns, sterile gloves, large sterile drape, hand hygiene, and betadine prep.  Under sterile conditions and local anesthesia, bilateralcommon femoral artery access was performed with a micropuncture needle. Ultrasound was utilized for access. Images were obtained for documentation. Guidewire advanced followed by bilateral 5-French sheaths. A 5-French C2 catheter was utilized to select the contralateral left internal iliac artery. Selective left internal iliac angiogram was performed. The tortuous left uterine artery was identified. Selective catheterization was performed of the left uterine artery with a microcatheter and micro guide wire. A selective left uterine angiogram was performed. This demonstrated patency of the left uterine artery. Mild diffuse hypervascularity of the enlarged fibroid uterus. Access was adequate for embolization. For embolization,  1 vial of 500 - 739micron Embospheres were injected into the left uterine artery. Post embolization angiogram confirms complete stasis of the left uterine vascular territory. Microcatheter was removed.  A second C2 catheter was utilized to select the  right internal iliac artery. Selective right internal iliac angiogram was performed. The patent right uterine artery was identified. For selective catheterization, the micro catheter and guidewire were utilized to select the right uterine artery. Selective right uterine angiogram was performed. This demonstrated patency of the right uterine artery. Catheter position was safe for embolization. Embolization was performed to complete stasis with injection of 1 and 1/3 vials of 500-700 micron Embospheres. Post embolization angiogram confirms complete stasis of the right uterine vascular territory.  Next, a pigtail catheter was advanced over guidewire to the level of the renal arteries. Flush aortogram performed to evaluate for ovarian supply to the uterus. Ovarian arteries are demonstrated bilaterally off of the infrarenal aorta and with delayed imaging eventually demonstrate collateral flow to the uterine vasculature. Ovarian arteries are not enlarged. Because the ovarian arteries were not significantly enlarged and the fibroid volume is small, embolization of the ovarian arteries was not performed today. Based on clinical response, ovarian embolization may be necessary in the future.  Injection of bothcommon femoral artery sheaths confirms access is adequate for Starclose. Hemostasis was obtained with a 6-French Starclose device. No immediate complication. Peripheral pedal pulses are normal +2.  The patient tolerated the procedure well. No immediate complication.  IMPRESSION: Successful bilateral uterine artery embolization (U F E)   Electronically Signed   By: Jerilynn Mages.  Isac Lincks M.D.   On: 04/09/2014 12:41    Labs:  CBC:  Recent Labs  04/09/14 0809  WBC 7.0  HGB 11.6*    HCT 36.0  PLT 336    COAGS:  Recent Labs  04/09/14 0809  INR 0.96  APTT 36    BMP:  Recent Labs  04/09/14 0809  NA 136  K 4.1  CL 104  CO2 23  GLUCOSE 97  BUN 14  CALCIUM 9.3  CREATININE 0.69  GFRNONAA >90  GFRAA >90    Assessment and Plan:  1 week status post successful uterine fibroid embolization. Pelvic pain, cramping and fatigue are slowly improving. Minor residual vaginal spotting. Nonspecific right thigh pain without evidence of vascular compromise or complication.  Continue recovery as an outpatient. She was encouraged to walk daily. She will return to work after a full 2 weeks of recovery.  SignedGreggory Keen 04/16/2014, 2:40 PM   I spent a total of25 Minutes in face to face in clinical consultation, greater than 50% of which was counseling/coordinating care for the patient.

## 2014-05-01 ENCOUNTER — Other Ambulatory Visit: Payer: Federal, State, Local not specified - PPO

## 2014-06-18 ENCOUNTER — Telehealth: Payer: Self-pay | Admitting: *Deleted

## 2014-06-18 NOTE — Telephone Encounter (Signed)
3 mth f/u post [place] pt says she is doing great.

## 2014-08-13 ENCOUNTER — Encounter (HOSPITAL_COMMUNITY): Payer: Self-pay | Admitting: Emergency Medicine

## 2014-08-13 ENCOUNTER — Emergency Department (HOSPITAL_COMMUNITY)
Admission: EM | Admit: 2014-08-13 | Discharge: 2014-08-13 | Disposition: A | Discharge status: Home / Self Care | Payer: Federal, State, Local not specified - PPO | Source: Home / Self Care | Attending: Family Medicine | Admitting: Family Medicine

## 2014-08-13 DIAGNOSIS — Z720 Tobacco use: Secondary | ICD-10-CM

## 2014-08-13 DIAGNOSIS — I10 Essential (primary) hypertension: Secondary | ICD-10-CM | POA: Diagnosis not present

## 2014-08-13 NOTE — ED Notes (Signed)
Patient reports that when she goes for routine appts, the office is mentioning blood pressure is elevated.  Patient has brought in readings of 158/100, 70, 99%-sitting, 156/85, 98%, 70-standing.  Also, 152/73, 73, 99%.  Headache 2-3 times a week for 2 months.

## 2014-08-13 NOTE — ED Provider Notes (Signed)
CSN: [id number]     Arrival date & time 08/13/14  87 History   First MD Initiated Contact with Patient 08/13/14 1708     Chief Complaint  Patient presents with  . Hypertension   (Consider location/radiation/quality/duration/timing/severity/associated sxs/prior Treatment) HPI  Patient states that she was at the [place] earlier today and had a headache. One of the nurses there decided to check her blood pressure noted that it was 163/102 area multiple subsequent readings over the course of the day showed pressures ranging from 196 systolic 222 systolic and diastolic readings in the 97L to 90s. Patient is without headache at this time. Denies any chest pain, shortness of breath, palpitations, neck stiffness, LOC, dizziness, nausea, diaphoresis, change in mentation. Patient states that she has an endocrinologist working out a thyroid condition as well as an OB/GYN who treated her for uterine fibroids but has no primary care physician.  Past Medical History  Diagnosis Date  . Acid reflux   . Fibroids   . Post-operative pain 04/11/2014   Past Surgical History  Procedure Laterality Date  . Tubal ligation     Family History  Problem Relation Age of Onset  . Cancer Neg Hx   . Diabetes Neg Hx   . Heart failure Neg Hx    History  Substance Use Topics  . Smoking status: Current Every Day Smoker -- 0.25 packs/day    Types: Cigarettes  . Smokeless tobacco: Not on file  . Alcohol Use: Yes     Comment: occassional   OB History    Gravida Para Term Preterm AB TAB SAB Ectopic Multiple Living   2 2 2       2      Review of Systems Per HPI with all other pertinent systems negative.   Allergies  Oxycodone  Home Medications   Prior to Admission medications   Medication Sig Start Date End Date Taking? Authorizing Provider  Ergocalciferol (VITAMIN D2) 2000 UNITS TABS Take 1 tablet by mouth daily.    Historical Provider, MD  IRON PO Take 1 tablet by mouth every morning.    Historical Provider, MD   Multiple Vitamin (MULTIVITAMIN) tablet Take 1 tablet by mouth every morning.     Historical Provider, MD  psyllium (REGULOID) 0.52 G capsule Take 1.04 g by mouth every morning.    Historical Provider, MD   BP 123/70 mmHg  Pulse 66  Temp(Src) 98.4 F (36.9 C) (Oral)  Resp 16  SpO2 99%  LMP 08/06/2014 Physical Exam Physical Exam  Constitutional: oriented to person, place, and time. appears well-developed and well-nourished. No distress.  HENT:  Head: Normocephalic and atraumatic.  Eyes: EOMI. PERRL.  Neck: Normal range of motion. Enlargement of the thyroid from earlier on the right..  Cardiovascular: RRR, no m/r/g, 2+ distal pulses,  Pulmonary/Chest: Effort normal and breath sounds normal. No respiratory distress.  Abdominal: Soft. Bowel sounds are normal. NonTTP, no distension.  Musculoskeletal: Normal range of motion. Non ttp, no effusion.  Neurological: alert and oriented to person, place, and time.  Skin: Skin is warm. No rash noted. non diaphoretic.  Psychiatric: normal mood and affect. behavior is normal. Judgment and thought content normal.   ED Course  Procedures (including critical care time) Labs Review Labs Reviewed - No data to display  Imaging Review No results found.   MDM   1. Intermittent hypertension   2. Tobacco use    Normotensive in clinic. No need for treatment at this time. Discussed causes of hypertension recommending  patient get regular aerobic exercise, increase her fluid intake, decrease her sodium intake, continue her thyroid workup, and stop smoking. Smoking cessation counseling provided. Patient to establish care with a new primary care physician sometime in the next 1-2 weeks for further management if blood pressures remain elevated. Should to go to the emergency room if she develops blood pressures greater than 200/110 and is symptomatically.    Waldemar Dickens, MD 08/13/14 [phone]

## 2014-08-13 NOTE — Discharge Instructions (Signed)
Your blood pressure was normal in our clinic today. Please check your blood pressures 1-2 times per day and keep a record. Please call a physician's office tomorrow to establish care and further treatment of high blood pressure. Seek immediate medical attention in the emergency room if your blood pressure is higher than 200/110 and you have headaches or other symptoms.

## 2014-11-04 ENCOUNTER — Inpatient Hospital Stay (HOSPITAL_COMMUNITY)
Admission: AD | Admit: 2014-11-04 | Discharge: 2014-11-04 | Disposition: A | Discharge status: Home / Self Care | Payer: Federal, State, Local not specified - PPO | Source: Ambulatory Visit | Attending: Obstetrics and Gynecology | Admitting: Obstetrics and Gynecology

## 2014-11-04 ENCOUNTER — Encounter (HOSPITAL_COMMUNITY): Payer: Self-pay

## 2014-11-04 DIAGNOSIS — R109 Unspecified abdominal pain: Secondary | ICD-10-CM | POA: Diagnosis not present

## 2014-11-04 DIAGNOSIS — F17210 Nicotine dependence, cigarettes, uncomplicated: Secondary | ICD-10-CM | POA: Diagnosis not present

## 2014-11-04 DIAGNOSIS — N390 Urinary tract infection, site not specified: Secondary | ICD-10-CM | POA: Insufficient documentation

## 2014-11-04 DIAGNOSIS — N9489 Other specified conditions associated with female genital organs and menstrual cycle: Secondary | ICD-10-CM

## 2014-11-04 DIAGNOSIS — N920 Excessive and frequent menstruation with regular cycle: Secondary | ICD-10-CM

## 2014-11-04 DIAGNOSIS — R300 Dysuria: Secondary | ICD-10-CM | POA: Diagnosis present

## 2014-11-04 LAB — URINE MICROSCOPIC-ADD ON

## 2014-11-04 LAB — URINALYSIS, ROUTINE W REFLEX MICROSCOPIC
BILIRUBIN URINE: NEGATIVE
Glucose, UA: NEGATIVE mg/dL
Ketones, ur: NEGATIVE mg/dL
NITRITE: POSITIVE — AB
Protein, ur: 30 mg/dL — AB
Specific Gravity, Urine: 1.02 (ref 1.005–1.030)
UROBILINOGEN UA: 1 mg/dL (ref 0.0–1.0)
pH: 7.5 (ref 5.0–8.0)

## 2014-11-04 LAB — POCT PREGNANCY, URINE: PREG TEST UR: NEGATIVE

## 2014-11-04 MED ORDER — NITROFURANTOIN MONOHYD MACRO 100 MG PO CAPS: 100.0000 mg | ORAL_CAPSULE | Freq: Two times a day (BID) | ORAL | Status: AC

## 2014-11-04 MED ORDER — URIBEL 118 MG PO CAPS: 1.0000 | ORAL_CAPSULE | Freq: Four times a day (QID) | ORAL | Status: DC

## 2014-11-04 NOTE — MAU Note (Signed)
Yesterday was feeling bad, cramping, started spotting vag.  Today when woke up, was still cramping and noted burning with urination. Seeing pinkish when wipes. Frequency and urgency

## 2014-11-04 NOTE — Discharge Instructions (Signed)

## 2014-11-04 NOTE — MAU Provider Note (Signed)
History     CSN: [id number]  Arrival date and time: 11/04/14 1449   None     Chief Complaint  Patient presents with  . Dysuria  . Vaginal Bleeding   HPI Comments: [id number] non-pregnant female c/o mild cramping and spotting that started last night followed by urinary urgency, frequency, and dysuria today. No fever or back pain. New partner x6 mos. Desires STD screen. Contracepting with BTL. LMP 10/08/14.   Past Medical History  Diagnosis Date  . Acid reflux   . Fibroids   . Post-operative pain 04/11/2014    Past Surgical History  Procedure Laterality Date  . Tubal ligation    . Robotic assisted laparoscopic vaginal hysterectomy with fibroid removal      Family History  Problem Relation Age of Onset  . Cancer Neg Hx   . Diabetes Neg Hx   . Heart failure Neg Hx     Social History  Substance Use Topics  . Smoking status: Current Every Day Smoker -- 0.25 packs/day    Types: Cigarettes  . Smokeless tobacco: None  . Alcohol Use: Yes     Comment: occassional    Allergies:  Allergies  Allergen Reactions  . Oxycodone Itching and Nausea Only    Prescriptions prior to admission  Medication Sig Dispense Refill Last Dose  . BIOTIN PO Take 1 tablet by mouth daily.   11/03/2014 at Unknown time  . Cholecalciferol (VITAMIN D-3 PO) Take 1 tablet by mouth daily.   11/03/2014 at Unknown time  . IRON PO Take 1 tablet by mouth daily.    11/03/2014 at Unknown time  . Multiple Vitamin (MULTIVITAMIN) tablet Take 1 tablet by mouth daily.    11/03/2014 at Unknown time    Review of Systems  Constitutional: Negative.   HENT: Negative.   Eyes: Negative.   Respiratory: Negative.   Cardiovascular: Negative.   Gastrointestinal: Negative.   Genitourinary: Positive for dysuria, urgency and frequency. Negative for hematuria and flank pain.  Musculoskeletal: Negative.   Skin: Negative.   Neurological: Negative.   Endo/Heme/Allergies: Negative.   Psychiatric/Behavioral: Negative.     Physical Exam   Blood pressure 140/80, pulse 76, temperature 97.9 F (36.6 C), temperature source Oral, resp. rate 18, height 5' 6.5" (1.689 m), weight 77.111 kg (170 lb), last menstrual period 10/08/2014.  Physical Exam  Constitutional: She is oriented to person, place, and time. She appears well-developed and well-nourished.  HENT:  Head: Normocephalic and atraumatic.  Neck: Normal range of motion. Neck supple.  Cardiovascular: Normal rate.   Respiratory: Effort normal.  GI: Soft.  Genitourinary:  Speculum: parous, no lesion, no discharge, heme absent Bimanual: +SP tenderness, normal uterus-anteverted, no adnexal mass or tenderness  Musculoskeletal: Normal range of motion.  Neurological: She is alert and oriented to person, place, and time.  Skin: Skin is warm and dry.  Psychiatric: She has a normal mood and affect.    MAU Course  Procedures  Results for FLORIDE, HUTMACHER (MRN [id number]) as of 11/04/2014 16:55  Ref. Range 11/04/2014 15:05 11/04/2014 15:13  Preg Test, Ur Latest Ref Range: NEGATIVE   NEGATIVE  Appearance Latest Ref Range: CLEAR  CLOUDY (A)   Bacteria, UA Latest Ref Range: RARE  MANY (A)   Bilirubin Urine Latest Ref Range: NEGATIVE  NEGATIVE   Color, Urine Latest Ref Range: YELLOW  YELLOW   Glucose Latest Ref Range: NEGATIVE mg/dL NEGATIVE   Hgb urine dipstick Latest Ref Range: NEGATIVE  LARGE (A)   Ketones, ur Latest  Ref Range: NEGATIVE mg/dL NEGATIVE   Leukocytes, UA Latest Ref Range: NEGATIVE  LARGE (A)   Nitrite Latest Ref Range: NEGATIVE  POSITIVE (A)   pH Latest Ref Range: 5.0-8.0  7.5   Protein Latest Ref Range: NEGATIVE mg/dL 30 (A)   RBC / HPF Latest Ref Range: <3 RBC/hpf 7-10   Specific Gravity, Urine Latest Ref Range: 1.005-1.030  1.020   Squamous Epithelial / LPF Latest Ref Range: RARE  FEW (A)   Urobilinogen, UA Latest Ref Range: 0.0-1.0 mg/dL 1.0   WBC, UA Latest Ref Range: <3 WBC/hpf TOO NUMEROUS TO C...    Assessment and Plan   Urinary tract infection Cramping and spotting-likely onset of menses  Discharge home Macrobid 100 mg po bid x7 days #14, no refill Uribel 1 cap QID x3 days #12, no refill Increase water intake: 5-6 bottles per day Cranberry juice 2-3 glasses per day Avoid IC until sx resolved Follow-up at [facility] for AEX  Instructed to call office before any MAU visit  [place], [place], N 11/04/2014, 4:38 PM

## 2014-11-04 NOTE — Progress Notes (Signed)
Notified of pt arrival in MAU and complaint. Will come see pt 

## 2014-11-05 LAB — GC/CHLAMYDIA PROBE AMP (~~LOC~~) NOT AT ARMC
Chlamydia: NEGATIVE
Neisseria Gonorrhea: NEGATIVE

## 2014-11-06 LAB — URINE CULTURE: Culture: >=100000

## 2015-03-19 ENCOUNTER — Other Ambulatory Visit (HOSPITAL_COMMUNITY): Payer: Self-pay | Admitting: Interventional Radiology

## 2015-03-19 DIAGNOSIS — D251 Intramural leiomyoma of uterus: Secondary | ICD-10-CM

## 2015-03-24 ENCOUNTER — Other Ambulatory Visit: Payer: Federal, State, Local not specified - PPO

## 2015-04-07 ENCOUNTER — Encounter (HOSPITAL_COMMUNITY): Payer: Self-pay | Admitting: Advanced Practice Midwife

## 2015-04-07 ENCOUNTER — Inpatient Hospital Stay (HOSPITAL_COMMUNITY)
Admission: AD | Admit: 2015-04-07 | Discharge: 2015-04-07 | Disposition: A | Discharge status: Home / Self Care | Payer: Federal, State, Local not specified - PPO | Source: Ambulatory Visit | Attending: Family Medicine | Admitting: Family Medicine

## 2015-04-07 DIAGNOSIS — B373 Candidiasis of vulva and vagina: Secondary | ICD-10-CM | POA: Diagnosis not present

## 2015-04-07 DIAGNOSIS — M545 Low back pain, unspecified: Secondary | ICD-10-CM

## 2015-04-07 DIAGNOSIS — K219 Gastro-esophageal reflux disease without esophagitis: Secondary | ICD-10-CM | POA: Diagnosis not present

## 2015-04-07 DIAGNOSIS — R109 Unspecified abdominal pain: Secondary | ICD-10-CM | POA: Diagnosis present

## 2015-04-07 DIAGNOSIS — Z79899 Other long term (current) drug therapy: Secondary | ICD-10-CM | POA: Diagnosis not present

## 2015-04-07 DIAGNOSIS — F17210 Nicotine dependence, cigarettes, uncomplicated: Secondary | ICD-10-CM | POA: Diagnosis not present

## 2015-04-07 DIAGNOSIS — Z87440 Personal history of urinary (tract) infections: Secondary | ICD-10-CM | POA: Insufficient documentation

## 2015-04-07 DIAGNOSIS — B3731 Acute candidiasis of vulva and vagina: Secondary | ICD-10-CM

## 2015-04-07 DIAGNOSIS — Z885 Allergy status to narcotic agent status: Secondary | ICD-10-CM | POA: Insufficient documentation

## 2015-04-07 DIAGNOSIS — R197 Diarrhea, unspecified: Secondary | ICD-10-CM | POA: Insufficient documentation

## 2015-04-07 DIAGNOSIS — M549 Dorsalgia, unspecified: Secondary | ICD-10-CM | POA: Insufficient documentation

## 2015-04-07 LAB — COMPREHENSIVE METABOLIC PANEL
ALBUMIN: 4.5 g/dL (ref 3.5–5.0)
ALK PHOS: 42 U/L (ref 38–126)
ALT: 26 U/L (ref 14–54)
AST: 23 U/L (ref 15–41)
Anion gap: 9 (ref 5–15)
BUN: 10 mg/dL (ref 6–20)
CO2: 25 mmol/L (ref 22–32)
CREATININE: 0.63 mg/dL (ref 0.44–1.00)
Calcium: 9.4 mg/dL (ref 8.9–10.3)
Chloride: 106 mmol/L (ref 101–111)
GFR calc Af Amer: >60 mL/min (ref >60)
GFR calc non Af Amer: >60 mL/min (ref >60)
GLUCOSE: 113 mg/dL — AB (ref 65–99)
Potassium: 4 mmol/L (ref 3.5–5.1)
SODIUM: 140 mmol/L (ref 135–145)
Total Bilirubin: 0.6 mg/dL (ref 0.3–1.2)
Total Protein: 7.5 g/dL (ref 6.5–8.1)

## 2015-04-07 LAB — URINALYSIS, ROUTINE W REFLEX MICROSCOPIC
Bilirubin Urine: NEGATIVE
GLUCOSE, UA: NEGATIVE mg/dL
Ketones, ur: NEGATIVE mg/dL
Nitrite: NEGATIVE
PH: 6.5 (ref 5.0–8.0)
Protein, ur: NEGATIVE mg/dL
Specific Gravity, Urine: 1.01 (ref 1.005–1.030)

## 2015-04-07 LAB — CBC
HCT: 35.7 % — ABNORMAL LOW (ref 36.0–46.0)
HEMOGLOBIN: 12.1 g/dL (ref 12.0–15.0)
MCH: 29.4 pg (ref 26.0–34.0)
MCHC: 33.9 g/dL (ref 30.0–36.0)
MCV: 86.7 fL (ref 78.0–100.0)
PLATELETS: 292 10*3/uL (ref 150–400)
RBC: 4.12 MIL/uL (ref 3.87–5.11)
RDW: 14.9 % (ref 11.5–15.5)
WBC: 6.4 10*3/uL (ref 4.0–10.5)

## 2015-04-07 LAB — URINE MICROSCOPIC-ADD ON: BACTERIA UA: NONE SEEN

## 2015-04-07 LAB — POCT PREGNANCY, URINE: Preg Test, Ur: NEGATIVE

## 2015-04-07 LAB — WET PREP, GENITAL
Clue Cells Wet Prep HPF POC: NONE SEEN
Sperm: NONE SEEN
TRICH WET PREP: NONE SEEN

## 2015-04-07 MED ORDER — CYCLOBENZAPRINE HCL 10 MG PO TABS: 10.0000 mg | ORAL_TABLET | Freq: Two times a day (BID) | ORAL | Status: DC | PRN

## 2015-04-07 MED ORDER — CYCLOBENZAPRINE HCL 10 MG PO TABS
10.0000 mg | ORAL_TABLET | Freq: Once | ORAL | Status: AC
  Administered 2015-04-07: 10 mg via ORAL
  Filled 2015-04-07: qty 1

## 2015-04-07 MED ORDER — CYCLOBENZAPRINE HCL 10 MG PO TABS: 10.0000 mg | ORAL_TABLET | Freq: Two times a day (BID) | ORAL | Status: AC | PRN

## 2015-04-07 MED ORDER — FLUCONAZOLE 150 MG PO TABS: 150.0000 mg | ORAL_TABLET | Freq: Every day | ORAL | Status: AC

## 2015-04-07 NOTE — MAU Note (Addendum)
Woke up at 0430 this morning.  Pain in rt flank; rt mid back.  Thought it was gas at first.  The pain has become worse.  Has been getting recurrent UTI's since Dec. Several doses of antibiotics. Just finished antibiotic on Sat morning for BV.   Seeing blood when she wipes, had pinkish d/c  Prior to blood.  Does not believe it is vaginal.  Diarrhea all day today- watery.  +left CVA tenderness

## 2015-04-07 NOTE — MAU Provider Note (Signed)
Care assumed from Hansel Feinstein, CNM Patient states Flexeril has improved her back pain significantly.  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 She works 7 days a week.   Results for orders placed or performed during the hospital encounter of 04/07/15 (from the past 24 hour(s))  Urinalysis, Routine w reflex microscopic (not at [facility])     Status: Abnormal   Collection Time: 04/07/15  6:15 PM  Result Value Ref Range   Color, Urine YELLOW YELLOW   APPearance HAZY (A) CLEAR   Specific Gravity, Urine 1.010 1.005 - 1.030   pH 6.5 5.0 - 8.0   Glucose, UA NEGATIVE NEGATIVE mg/dL   Hgb urine dipstick MODERATE (A) NEGATIVE   Bilirubin Urine NEGATIVE NEGATIVE   Ketones, ur NEGATIVE NEGATIVE mg/dL   Protein, ur NEGATIVE NEGATIVE mg/dL   Nitrite NEGATIVE NEGATIVE   Leukocytes, UA SMALL (A) NEGATIVE  Urine microscopic-add on     Status: Abnormal   Collection Time: 04/07/15  6:15 PM  Result Value Ref Range   Squamous Epithelial / LPF 0-5 (A) NONE SEEN   WBC, UA 0-5 0 - 5 WBC/hpf   RBC / HPF 0-5 0 - 5 RBC/hpf   Bacteria, UA NONE SEEN NONE SEEN  Pregnancy, urine POC     Status: None   Collection Time: 04/07/15  6:18 PM  Result Value Ref Range   Preg Test, Ur NEGATIVE NEGATIVE  Wet prep, genital     Status: Abnormal   Collection Time: 04/07/15  9:30 PM  Result Value Ref Range   Yeast Wet Prep HPF POC PRESENT (A) NONE SEEN   Trich, Wet Prep NONE SEEN NONE SEEN   Clue Cells Wet Prep HPF POC NONE SEEN NONE SEEN   WBC, Wet Prep HPF POC MODERATE (A) NONE SEEN   Sperm NONE SEEN   CBC     Status: Abnormal   Collection Time: 04/07/15  9:50 PM  Result Value Ref Range   WBC 6.4 4.0 - 10.5 K/uL   RBC 4.12 3.87 - 5.11 MIL/uL   Hemoglobin 12.1 12.0 - 15.0 g/dL   HCT 35.7 (L) 36.0 - 46.0 %   MCV 86.7 78.0 - 100.0 fL   MCH 29.4 26.0 - 34.0 pg   MCHC 33.9 30.0 - 36.0 g/dL   RDW 14.9 11.5 - 15.5 %   Platelets 292 150 - 400 K/uL  Comprehensive metabolic panel     Status: Abnormal   Collection Time: 04/07/15  9:50 PM  Result Value Ref Range   Sodium 140 135 - 145 mmol/L   Potassium 4.0 3.5 - 5.1 mmol/L   Chloride 106 101 - 111 mmol/L   CO2 25 22 - 32 mmol/L   Glucose, Bld 113 (H) 65 - 99 mg/dL   BUN 10 6 - 20 mg/dL   Creatinine, Ser 0.63 0.44 - 1.00 mg/dL   Calcium 9.4 8.9 - 10.3 mg/dL   Total Protein 7.5 6.5 - 8.1 g/dL   Albumin 4.5 3.5 - 5.0 g/dL   AST 23 15 - 41 U/L   ALT 26 14 - 54 U/L   Alkaline Phosphatase 42 38 - 126 U/L   Total Bilirubin 0.6 0.3 - 1.2 mg/dL   GFR calc non Af Amer >60 >60 mL/min   GFR calc Af Amer >60 >60 mL/min   Anion gap 9 5 - 15    MDM Urine culture ordered  A: Back pain, MSK Yeast vulvovaginitis  P: Discharge home  Rx for Flexeril and Diflucan given to patient  Warning signs for worsening condition discussed Discussed change in work activity for back pain Patient advised to follow-up with PCP of choice for back pain Patient may return to MAU as needed or if her condition were to change or worsen   Luvenia Redden, PA-C 04/07/2015 11:58 PM

## 2015-04-07 NOTE — MAU Provider Note (Signed)
Chief Complaint:  Flank Pain and Abdominal Pain   First Provider Initiated Contact with Patient 04/07/15 2106    HPI  Olivia Dunn is a 45 y.o. [id number] who presents to maternity admissions reporting Right flank pain since early morning.  States thinks it is a kidney infection, or a spasm from work.  Also c/o bleeding between periods.  Not sure if it is discharge or blood.  Admits it is red. She reports vaginal bleeding, no vaginal itching/burning, h/a, dizziness, n/v, or fever/chills.   Wants a note to be out of work tomorrow.  History is remarkable for uterine artery embolization last year.  Was a patient of Dr Garwin Brothers, but states is no longer. History states she had a hysterectomy, but she did not.  RN Note: Woke up at 0430 this morning. Pain in rt flank; rt mid back. Thought it was gas at first. The pain has become worse. Has been getting recurrent UTI's since Dec. Several doses of antibiotics. Just finished antibiotic on Sat morning for BV. Seeing blood when she wipes, had pinkish d/c Prior to blood. Does not believe it is vaginal. Diarrhea all day today- watery. +left CVA tenderness           Past Medical History: Past Medical History  Diagnosis Date  . Acid reflux   . Fibroids   . Post-operative pain 04/11/2014    Past obstetric history: OB History  Gravida Para Term Preterm AB SAB TAB Ectopic Multiple Living  2 2 2       2     # Outcome Date GA Lbr Len/2nd Weight Sex Delivery Anes PTL Lv  2 Term      Vag-Spont   Y  1 Term      Vag-Spont   Y      Past Surgical History: Past Surgical History  Procedure Laterality Date  . Tubal ligation    . Robotic assisted laparoscopic vaginal hysterectomy with fibroid removal    SHE DID NOT HAVE A HYSTERECTOMY Did have a uterine artery embolization in 2016  Family History: Family History  Problem Relation Age of Onset  . Cancer Neg Hx   . Diabetes Neg Hx   . Heart failure Neg Hx     Social  History: Social History  Substance Use Topics  . Smoking status: Current Every Day Smoker -- 0.25 packs/day    Types: Cigarettes  . Smokeless tobacco: Not on file  . Alcohol Use: Yes     Comment: occassional    Allergies:  Allergies  Allergen Reactions  . Oxycodone Itching and Nausea Only    Meds:  Prescriptions prior to admission  Medication Sig Dispense Refill Last Dose  . BIOTIN PO Take 1 tablet by mouth daily.   11/03/2014 at Unknown time  . Cholecalciferol (VITAMIN D-3 PO) Take 1 tablet by mouth daily.   11/03/2014 at Unknown time  . IRON PO Take 1 tablet by mouth daily.    11/03/2014 at Unknown time  . Meth-Hyo-M Bl-Na Phos-Ph Sal (URIBEL) 118 MG CAPS Take 1 capsule (118 mg total) by mouth 4 (four) times daily. 12 capsule 0   . Multiple Vitamin (MULTIVITAMIN) tablet Take 1 tablet by mouth daily.    11/03/2014 at Unknown time    I have reviewed patient's Past Medical Hx, Surgical Hx, Family Hx, Social Hx, medications and allergies.  ROS:  Review of Systems  Constitutional: Negative for fever, chills and fatigue.  Respiratory: Negative for shortness of breath.   Gastrointestinal: Positive  for abdominal pain and diarrhea. Negative for nausea, vomiting and constipation.  Genitourinary: Positive for flank pain (right) and vaginal bleeding. Negative for dysuria, vaginal discharge, difficulty urinating and pelvic pain.  Musculoskeletal: Positive for back pain (right flank). Negative for myalgias and neck pain.   Other systems negative   Physical Exam  Patient Vitals for the past 24 hrs:  BP Temp Temp src Pulse Resp Weight  04/07/15 1811 130/78 mmHg 98.8 F (37.1 C) Oral 66 18 170 lb 9.6 oz (77.384 kg)   Constitutional: Well-developed, well-nourished female in no acute distress.  Cardiovascular: normal rate and rhythm, no ectopy audible, S1 & S2 heard, no murmur Respiratory: normal effort, no distress. Lungs CTAB with no wheezes or crackles GI: Abd soft, non-tender.   Nondistended.  No rebound, No guarding.  Bowel Sounds audible  MS: Extremities nontender, no edema, normal ROM     Tender to palpation on right flank/paraspinal muscles Neurologic: Alert and oriented x 4.   Grossly nonfocal. GU: Neg CVAT. Skin:  Warm and Dry Psych:  Affect appropriate.  PELVIC EXAM: Cervix pink, visually closed, very irregular surface, small amount bloody discharge, vaginal walls and external genitalia normal Bimanual exam: Cervix firm, very irregular surface (hx cryo), neg CMT, uterus nontender, nonenlarged, adnexa without tenderness, enlargement, or mass    Labs:  Imaging:   MAU Course/MDM: I have ordered labs as follows:  CBC, CMET, UA, GC, chlamydia, HIV, wet prep Imaging ordered: none  Suspect flank pain may be muscle spasm  >> will try a dose of Flexeril   Assessment: Right flank/back pain Diarrhea x 1 day Dysfunctional uterine bleeding Hx uterine artery embolization   Plan: Care turned over to oncoming PA-C Awaiting lab results to rule out overt abdominal process    Medication List    ASK your doctor about these medications        BIOTIN PO  Take 1 tablet by mouth daily.     IRON PO  Take 1 tablet by mouth daily.     multivitamin tablet  Take 1 tablet by mouth daily.     URIBEL 118 MG Caps  Take 1 capsule (118 mg total) by mouth 4 (four) times daily.     VITAMIN D-3 PO  Take 1 tablet by mouth daily.        Hansel Feinstein CNM, MSN Certified Nurse-Midwife 04/07/2015 9:07 PM

## 2015-04-07 NOTE — Discharge Instructions (Signed)
Back Pain, Adult °Back pain is very common in adults. The cause of back pain is rarely dangerous and the pain often gets better over time. The cause of your back pain may not be known. Some common causes of back pain include: °· Strain of the muscles or ligaments supporting the spine. °· Wear and tear (degeneration) of the spinal disks. °· Arthritis. °· Direct injury to the back. °For many people, back pain may return. Since back pain is rarely dangerous, most people can learn to manage this condition on their own. °HOME CARE INSTRUCTIONS °Watch your back pain for any changes. The following actions may help to lessen any discomfort you are feeling: °· Remain active. It is stressful on your back to sit or stand in one place for long periods of time. Do not sit, drive, or stand in one place for more than 30 minutes at a time. Take short walks on even surfaces as soon as you are able. Try to increase the length of time you walk each day. °· Exercise regularly as directed by your health care provider. Exercise helps your back heal faster. It also helps avoid future injury by keeping your muscles strong and flexible. °· Do not stay in bed. Resting more than 1-2 days can delay your recovery. °· Pay attention to your body when you bend and lift. The most comfortable positions are those that put less stress on your recovering back. Always use proper lifting techniques, including: °· Bending your knees. °· Keeping the load close to your body. °· Avoiding twisting. °· Find a comfortable position to sleep. Use a firm mattress and lie on your side with your knees slightly bent. If you lie on your back, put a pillow under your knees. °· Avoid feeling anxious or stressed. Stress increases muscle tension and can worsen back pain. It is important to recognize when you are anxious or stressed and learn ways to manage it, such as with exercise. °· Take medicines only as directed by your health care provider. Over-the-counter  medicines to reduce pain and inflammation are often the most helpful. Your health care provider may prescribe muscle relaxant drugs. These medicines help dull your pain so you can more quickly return to your normal activities and healthy exercise. °· Apply ice to the injured area: °· Put ice in a plastic bag. °· Place a towel between your skin and the bag. °· Leave the ice on for 20 minutes, 2-3 times a day for the first 2-3 days. After that, ice and heat may be alternated to reduce pain and spasms. °· Maintain a healthy weight. Excess weight puts extra stress on your back and makes it difficult to maintain good posture. °SEEK MEDICAL CARE IF: °· You have pain that is not relieved with rest or medicine. °· You have increasing pain going down into the legs or buttocks. °· You have pain that does not improve in one week. °· You have night pain. °· You lose weight. °· You have a fever or chills. °SEEK IMMEDIATE MEDICAL CARE IF:  °· You develop new bowel or bladder control problems. °· You have unusual weakness or numbness in your arms or legs. °· You develop nausea or vomiting. °· You develop abdominal pain. °· You feel faint. °  °This information is not intended to replace advice given to you by your health care provider. Make sure you discuss any questions you have with your health care provider. °  °Document Released: 12/27/2004 Document Revised: 01/17/2014 Document Reviewed: 04/30/2013 °Elsevier Interactive Patient Education ©2016 [employer]   [employer] Monilial Vaginitis Vaginitis in a soreness, swelling and redness (inflammation) of the vagina and vulva. Monilial vaginitis is not a sexually transmitted infection. CAUSES  Yeast vaginitis is caused by yeast (candida) that is normally found in your vagina. With a yeast infection, the candida has overgrown in number to a point that upsets the chemical balance. SYMPTOMS   White, thick vaginal discharge.  Swelling, itching, redness and irritation of the vagina and  possibly the lips of the vagina (vulva).  Burning or painful urination.  Painful intercourse. DIAGNOSIS  Things that may contribute to monilial vaginitis are:  Postmenopausal and virginal states.  Pregnancy.  Infections.  Being tired, sick or stressed, especially if you had monilial vaginitis in the past.  Diabetes. Good control will help lower the chance.  Birth control pills.  Tight fitting garments.  Using bubble bath, feminine sprays, douches or deodorant tampons.  Taking certain medications that kill germs (antibiotics).  Sporadic recurrence can occur if you become ill. TREATMENT  Your caregiver will give you medication.  There are several kinds of anti monilial vaginal creams and suppositories specific for monilial vaginitis. For recurrent yeast infections, use a suppository or cream in the vagina 2 times a week, or as directed.  Anti-monilial or steroid cream for the itching or irritation of the vulva may also be used. Get your caregiver's permission.  Painting the vagina with methylene blue solution may help if the monilial cream does not work.  Eating yogurt may help prevent monilial vaginitis. HOME CARE INSTRUCTIONS   Finish all medication as prescribed.  Do not have sex until treatment is completed or after your caregiver tells you it is okay.  Take warm sitz baths.  Do not douche.  Do not use tampons, especially scented ones.  Wear cotton underwear.  Avoid tight pants and panty hose.  Tell your sexual partner that you have a yeast infection. They should go to their caregiver if they have symptoms such as mild rash or itching.  Your sexual partner should be treated as well if your infection is difficult to eliminate.  Practice safer sex. Use condoms.  Some vaginal medications cause latex condoms to fail. Vaginal medications that harm condoms are:  Cleocin cream.  Butoconazole (Femstat).  Terconazole (Terazol) vaginal  suppository.  Miconazole (Monistat) (may be purchased over the counter). SEEK MEDICAL CARE IF:   You have a temperature by mouth above 102 F (38.9 C).  The infection is getting worse after 2 days of treatment.  The infection is not getting better after 3 days of treatment.  You develop blisters in or around your vagina.  You develop vaginal bleeding, and it is not your menstrual period.  You have pain when you urinate.  You develop intestinal problems.  You have pain with sexual intercourse.   This information is not intended to replace advice given to you by your health care provider. Make sure you discuss any questions you have with your health care provider.   Document Released: 10/06/2004 Document Revised: 03/21/2011 Document Reviewed: 06/30/2014 Elsevier Interactive Patient Education [employer].

## 2015-04-08 LAB — GC/CHLAMYDIA PROBE AMP (~~LOC~~) NOT AT ARMC
Chlamydia: NEGATIVE
Neisseria Gonorrhea: NEGATIVE

## 2015-04-08 LAB — HIV ANTIBODY (ROUTINE TESTING W REFLEX): HIV Screen 4th Generation wRfx: NONREACTIVE

## 2015-04-09 LAB — URINE CULTURE

## 2015-05-02 ENCOUNTER — Ambulatory Visit (HOSPITAL_COMMUNITY)
Admission: EM | Admit: 2015-05-02 | Discharge: 2015-05-02 | Disposition: A | Discharge status: Home / Self Care | Payer: Federal, State, Local not specified - PPO | Attending: Family Medicine | Admitting: Family Medicine

## 2015-05-02 ENCOUNTER — Encounter (HOSPITAL_COMMUNITY): Payer: Self-pay | Admitting: *Deleted

## 2015-05-02 DIAGNOSIS — K219 Gastro-esophageal reflux disease without esophagitis: Secondary | ICD-10-CM | POA: Diagnosis not present

## 2015-05-02 DIAGNOSIS — Z9889 Other specified postprocedural states: Secondary | ICD-10-CM | POA: Insufficient documentation

## 2015-05-02 DIAGNOSIS — A499 Bacterial infection, unspecified: Secondary | ICD-10-CM | POA: Diagnosis not present

## 2015-05-02 DIAGNOSIS — N760 Acute vaginitis: Secondary | ICD-10-CM | POA: Insufficient documentation

## 2015-05-02 DIAGNOSIS — Z114 Encounter for screening for human immunodeficiency virus [HIV]: Secondary | ICD-10-CM | POA: Insufficient documentation

## 2015-05-02 DIAGNOSIS — Z79899 Other long term (current) drug therapy: Secondary | ICD-10-CM | POA: Diagnosis not present

## 2015-05-02 DIAGNOSIS — B9689 Other specified bacterial agents as the cause of diseases classified elsewhere: Secondary | ICD-10-CM

## 2015-05-02 DIAGNOSIS — F17210 Nicotine dependence, cigarettes, uncomplicated: Secondary | ICD-10-CM | POA: Insufficient documentation

## 2015-05-02 DIAGNOSIS — N898 Other specified noninflammatory disorders of vagina: Secondary | ICD-10-CM | POA: Insufficient documentation

## 2015-05-02 DIAGNOSIS — Z888 Allergy status to other drugs, medicaments and biological substances status: Secondary | ICD-10-CM | POA: Insufficient documentation

## 2015-05-02 MED ORDER — METRONIDAZOLE 0.75 % VA GEL: 1.0000 | Freq: Every day | VAGINAL | Status: AC

## 2015-05-02 NOTE — ED Notes (Signed)
C/O frequent bouts of BV & UTIs over past 8 months, states usually occurs after sex with partner.  Educated pt on importance of urinating after intercourse.  C/O malodorous vaginal discharge x 2 days.

## 2015-05-02 NOTE — ED Provider Notes (Signed)
CSN: [id number]     Arrival date & time 05/02/15  1835 History   First MD Initiated Contact with Patient 05/02/15 1847     Chief Complaint  Patient presents with  . Vaginal Discharge   (Consider location/radiation/quality/duration/timing/severity/associated sxs/prior Treatment) Patient is a 45 y.o. female presenting with vaginal discharge. The history is provided by the patient.  Vaginal Discharge Quality:  Malodorous and thin Severity:  Mild Onset quality:  Gradual Duration:  2 days Progression:  Unchanged Chronicity:  New Context: after intercourse   Relieved by:  None tried Worsened by:  Nothing tried Ineffective treatments:  None tried Associated symptoms: no abdominal pain, no dyspareunia, no dysuria and no fever   Risk factors: new sexual partner and unprotected sex     Past Medical History  Diagnosis Date  . Acid reflux   . Fibroids   . Post-operative pain 04/11/2014   Past Surgical History  Procedure Laterality Date  . Tubal ligation     Family History  Problem Relation Age of Onset  . Cancer Neg Hx   . Diabetes Neg Hx   . Heart failure Neg Hx    Social History  Substance Use Topics  . Smoking status: Current Every Day Smoker -- 0.25 packs/day    Types: Cigarettes  . Smokeless tobacco: None  . Alcohol Use: No   OB History    Gravida Para Term Preterm AB TAB SAB Ectopic Multiple Living   2 2 2       2      Review of Systems  Constitutional: Negative.  Negative for fever.  Gastrointestinal: Negative.  Negative for abdominal pain.  Genitourinary: Positive for vaginal discharge. Negative for dysuria, vaginal bleeding, vaginal pain, pelvic pain and dyspareunia.  All other systems reviewed and are negative.   Allergies  Oxycodone  Home Medications   Prior to Admission medications   Medication Sig Start Date End Date Taking? Authorizing Provider  Biotin w/ Vitamins C & E (HAIR SKIN & NAILS GUMMIES PO) Take 1 each by mouth daily.   Yes Historical  Provider, MD  Cholecalciferol (VITAMIN D-3 PO) Take 1 tablet by mouth daily.   Yes Historical Provider, MD  IRON PO Take 1 tablet by mouth daily.    Yes Historical Provider, MD  Multiple Vitamin (MULTIVITAMIN) tablet Take 1 tablet by mouth daily.    Yes Historical Provider, MD  cyclobenzaprine (FLEXERIL) 10 MG tablet Take 1 tablet (10 mg total) by mouth 2 (two) times daily as needed for muscle spasms. 04/07/15   Luvenia Redden, PA-C  fluconazole (DIFLUCAN) 150 MG tablet Take 1 tablet (150 mg total) by mouth daily. 04/07/15   Luvenia Redden, PA-C  ibuprofen (ADVIL,MOTRIN) 200 MG tablet Take 600 mg by mouth every 6 (six) hours as needed for mild pain.    Historical Provider, MD  metroNIDAZOLE (METROGEL VAGINAL) 0.75 % vaginal gel Place 1 Applicatorful vaginally at bedtime. For 5 nights 05/02/15   Billy Fischer, MD   Meds Ordered and Administered this Visit  Medications - No data to display  BP 143/84 mmHg  Pulse 87  Temp(Src) 98.4 F (36.9 C)  Resp 16  SpO2 100%  LMP 03/28/2015 (Approximate) No data found.   Physical Exam  Constitutional: She is oriented to person, place, and time. She appears well-developed and well-nourished. No distress.  Abdominal: Soft. Bowel sounds are normal.  Genitourinary: Uterus normal. Vaginal discharge found.  Neurological: She is alert and oriented to person, place, and time.  Skin:  Skin is warm and dry.  Nursing note and vitals reviewed.   ED Course  Procedures (including critical care time)  Labs Review Labs Reviewed  HIV ANTIBODY (ROUTINE TESTING)  CERVICOVAGINAL ANCILLARY ONLY    Imaging Review No results found.   Visual Acuity Review  Right Eye Distance:   Left Eye Distance:   Bilateral Distance:    Right Eye Near:   Left Eye Near:    Bilateral Near:         MDM   1. BV (bacterial vaginosis)        Billy Fischer, MD 05/02/15 2041

## 2015-05-03 LAB — HIV ANTIBODY (ROUTINE TESTING W REFLEX): HIV Screen 4th Generation wRfx: NONREACTIVE

## 2015-05-04 LAB — CERVICOVAGINAL ANCILLARY ONLY
CHLAMYDIA, DNA PROBE: NEGATIVE
NEISSERIA GONORRHEA: NEGATIVE

## 2015-05-05 LAB — CERVICOVAGINAL ANCILLARY ONLY: Wet Prep (BD Affirm): POSITIVE — AB

## 2015-05-19 ENCOUNTER — Other Ambulatory Visit (HOSPITAL_COMMUNITY): Payer: Self-pay | Admitting: Interventional Radiology

## 2015-05-19 DIAGNOSIS — D251 Intramural leiomyoma of uterus: Secondary | ICD-10-CM

## 2015-06-03 ENCOUNTER — Other Ambulatory Visit (HOSPITAL_COMMUNITY): Payer: Federal, State, Local not specified - PPO

## 2016-01-13 ENCOUNTER — Encounter: Payer: Self-pay | Admitting: Obstetrics and Gynecology

## 2017-01-24 IMAGING — US US SOFT TISSUE HEAD/NECK
1 series · 14 of 25 positions shown · non-contrast
Comparison: None.

CLINICAL DATA: 43-year-old with thyroid goiter. Elevated TSH level.

EXAM:
THYROID ULTRASOUND
TECHNIQUE: Ultrasound examination of the thyroid gland and adjacent soft
tissues was performed.

[Series 1: us soft tissue head/neck · 0.05mm/px · 14 of 67 slices shown]
[im 1/67]
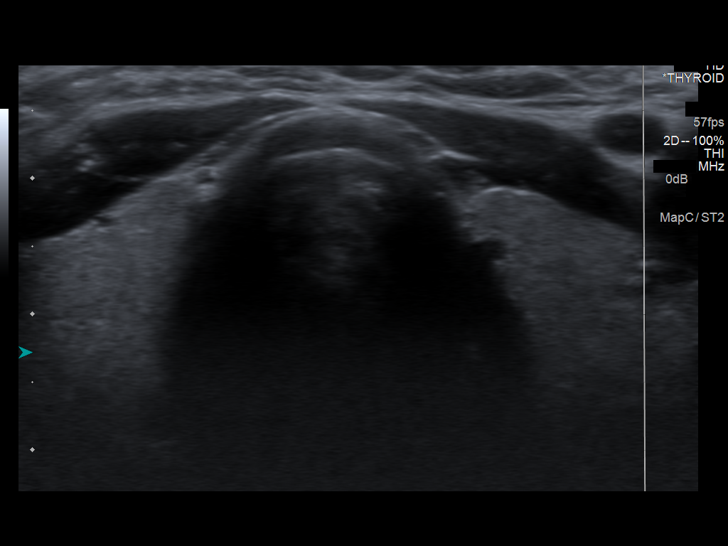
[im 6/67]
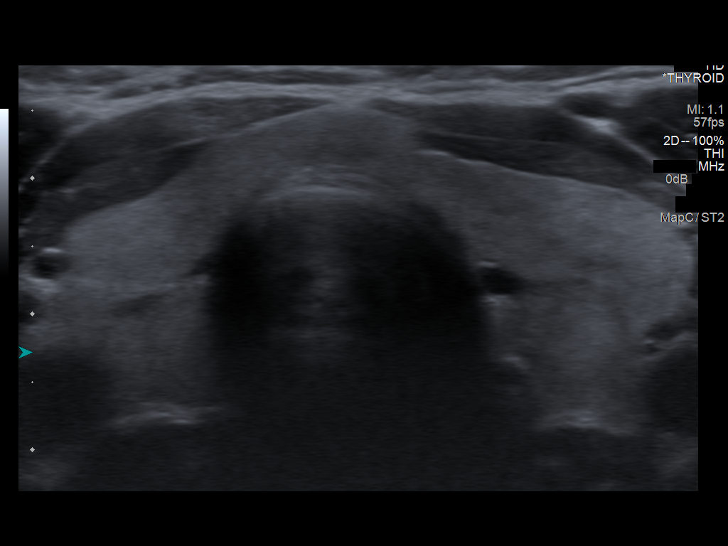
[im 12/67]
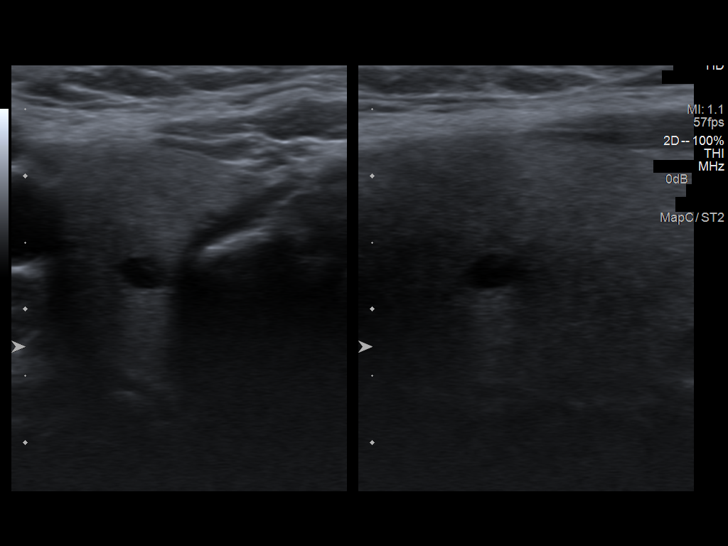
[im 17/67]
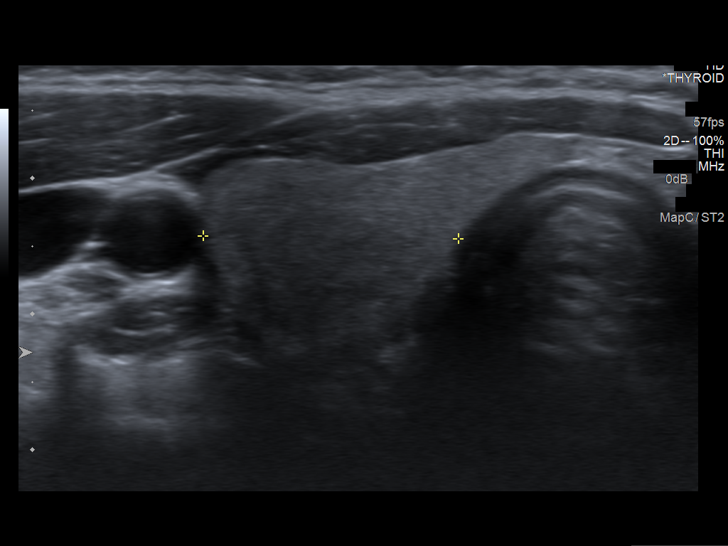
[im 23/67]
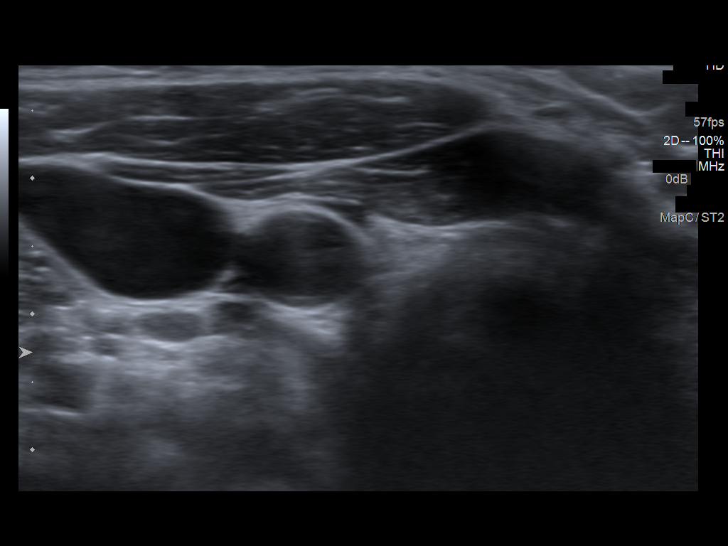
[im 25/67]
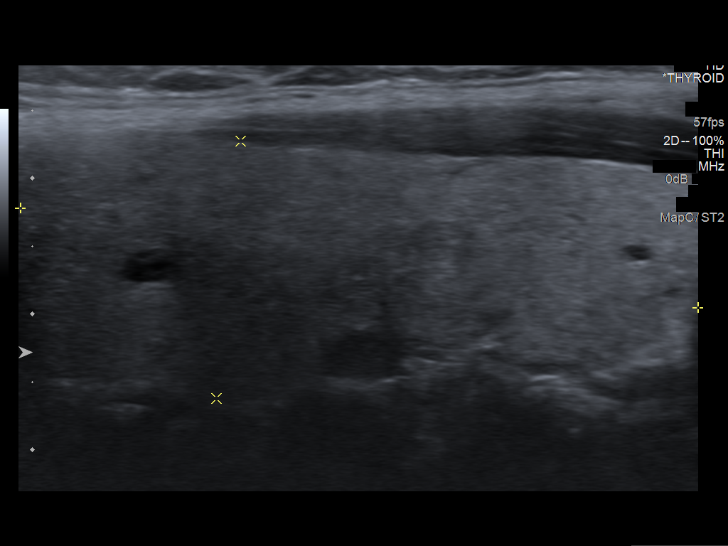
[im 31/67]
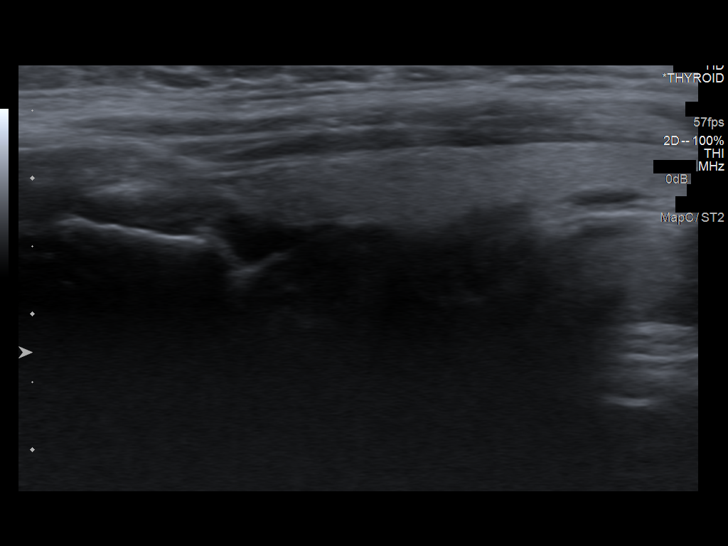
[im 36/67]
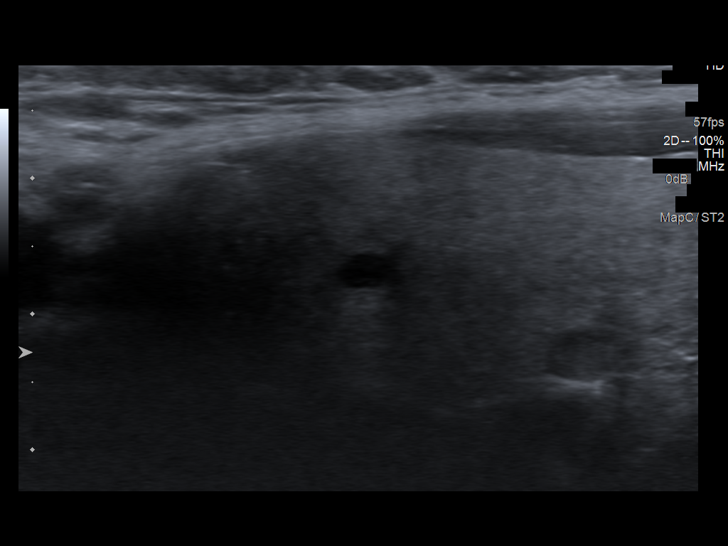
[im 42/67]
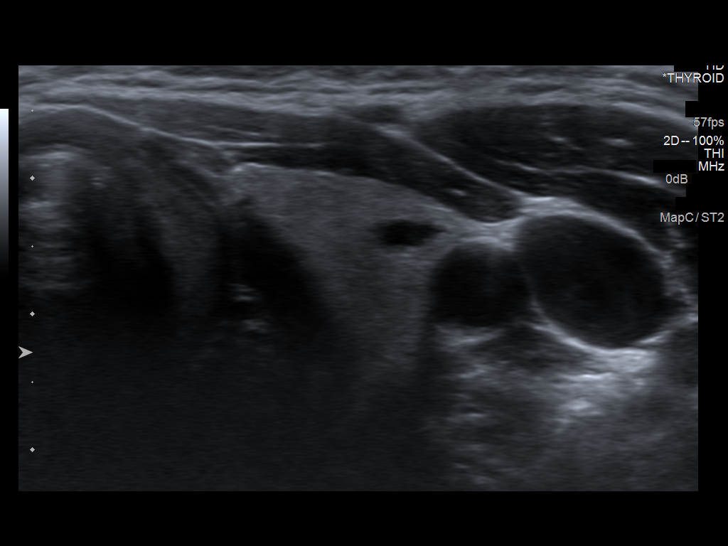
[im 45/67]
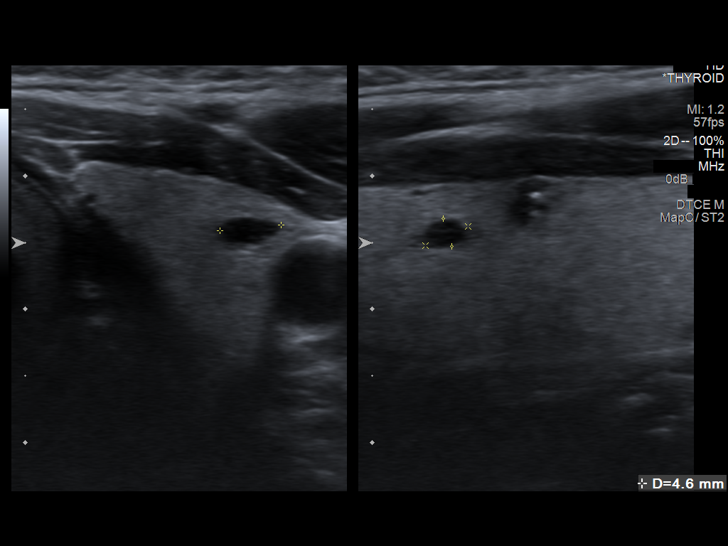
[im 50/67]
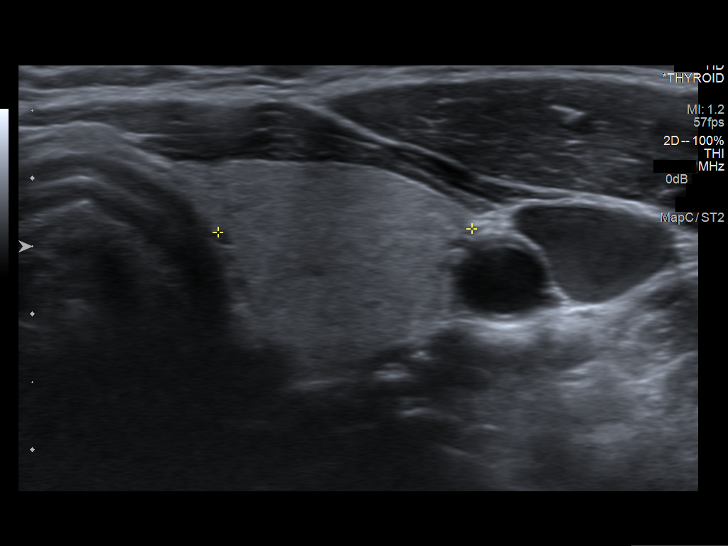
[im 56/67]
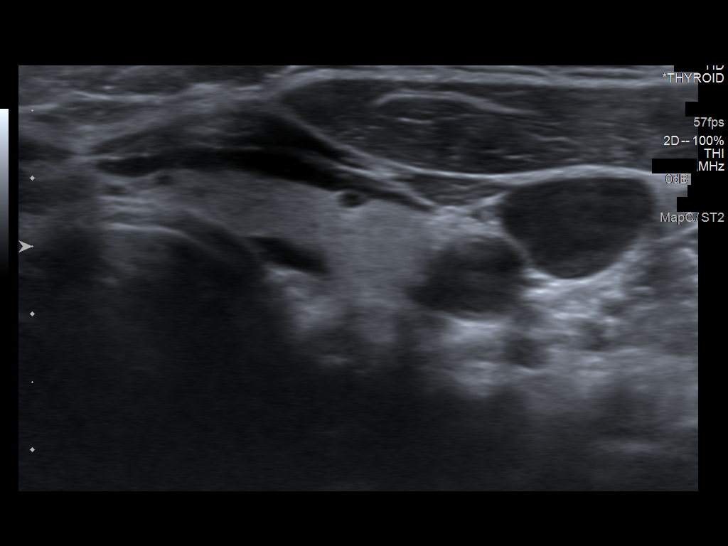
[im 61/67]
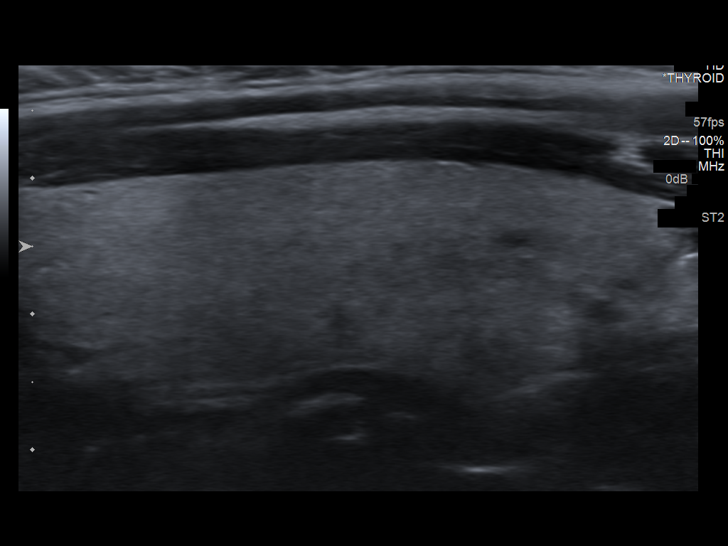
[im 67/67]
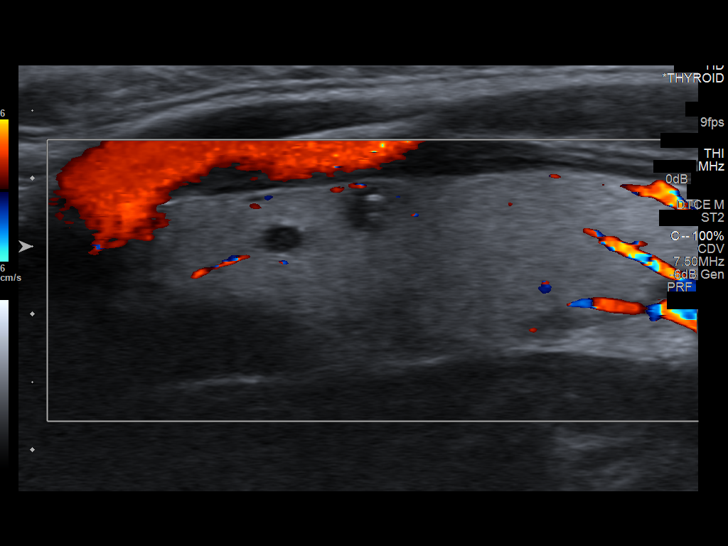

[14 of 25 positions shown; findings below may reference images not displayed]

FINDINGS: Right thyroid lobe

Measurements: 5.1 x 1.9 x 1.9 cm. Thyroid tissue is homogeneous.
There is a 0.5 cm hypoechoic structure in the mid right thyroid lobe
that could represent a small nodule or complex cyst.

Left thyroid lobe

Measurements: 5.0 x 1.8 x 1.9 cm. Hypoechoic structure in the
superior left thyroid lobe measures up to 0.5 cm and suggestive for
a small cyst. There is a complex nodule in the superior left thyroid
lobe with irregular borders and small calcifications. This nodule
measures 0.7 x 0.6 x 0.4 cm.

Isthmus

Thickness:  0.3 cm.  No nodules visualized.

Lymphadenopathy

None visualized.
IMPRESSION: Small thyroid nodules. Largest nodule measures 0.7 cm in the left
thyroid lobe. The dominant left thyroid nodule has irregular margins
and calcifications. Recommend follow-up of this nodule in 6-12
months.
